# Patient Record
Sex: Male | Born: 1975 | Race: Black or African American | Hispanic: No | Marital: Married | Smoking: Current some day smoker
Health system: Southern US, Community
[De-identification: ages and names within clinical notes are randomized; demographics above are authoritative.]

## PROBLEM LIST (undated history)

## (undated) DIAGNOSIS — Z87442 Personal history of urinary calculi: Secondary | ICD-10-CM

## (undated) DIAGNOSIS — I10 Essential (primary) hypertension: Secondary | ICD-10-CM

## (undated) DIAGNOSIS — K219 Gastro-esophageal reflux disease without esophagitis: Secondary | ICD-10-CM

## (undated) DIAGNOSIS — F329 Major depressive disorder, single episode, unspecified: Secondary | ICD-10-CM

## (undated) DIAGNOSIS — F419 Anxiety disorder, unspecified: Secondary | ICD-10-CM

## (undated) DIAGNOSIS — K8020 Calculus of gallbladder without cholecystitis without obstruction: Secondary | ICD-10-CM

## (undated) DIAGNOSIS — F32A Depression, unspecified: Secondary | ICD-10-CM

## (undated) DIAGNOSIS — D497 Neoplasm of unspecified behavior of endocrine glands and other parts of nervous system: Secondary | ICD-10-CM

---

## 2001-03-29 ENCOUNTER — Emergency Department (HOSPITAL_COMMUNITY): Admission: EM | Admit: 2001-03-29 | Discharge: 2001-03-29 | Payer: Self-pay | Admitting: Emergency Medicine

## 2001-08-03 ENCOUNTER — Emergency Department (HOSPITAL_COMMUNITY): Admission: EM | Admit: 2001-08-03 | Discharge: 2001-08-03 | Payer: Self-pay | Admitting: Emergency Medicine

## 2002-07-08 ENCOUNTER — Encounter: Payer: Self-pay | Admitting: *Deleted

## 2002-07-08 ENCOUNTER — Emergency Department (HOSPITAL_COMMUNITY): Admission: EM | Admit: 2002-07-08 | Discharge: 2002-07-08 | Payer: Self-pay | Admitting: *Deleted

## 2003-01-18 ENCOUNTER — Emergency Department (HOSPITAL_COMMUNITY): Admission: EM | Admit: 2003-01-18 | Discharge: 2003-01-18 | Payer: Self-pay | Admitting: *Deleted

## 2005-09-08 ENCOUNTER — Emergency Department (HOSPITAL_COMMUNITY): Admission: EM | Admit: 2005-09-08 | Discharge: 2005-09-08 | Payer: Self-pay | Admitting: Emergency Medicine

## 2006-11-21 ENCOUNTER — Emergency Department (HOSPITAL_COMMUNITY): Admission: EM | Admit: 2006-11-21 | Discharge: 2006-11-21 | Payer: Self-pay | Admitting: Emergency Medicine

## 2008-05-26 ENCOUNTER — Emergency Department (HOSPITAL_COMMUNITY): Admission: EM | Admit: 2008-05-26 | Discharge: 2008-05-26 | Payer: Self-pay

## 2009-09-08 ENCOUNTER — Emergency Department (HOSPITAL_COMMUNITY): Admission: EM | Admit: 2009-09-08 | Discharge: 2009-09-08 | Payer: Self-pay | Admitting: Emergency Medicine

## 2010-04-23 LAB — URINALYSIS, ROUTINE W REFLEX MICROSCOPIC
Glucose, UA: NEGATIVE mg/dL
Ketones, ur: NEGATIVE mg/dL
pH: 5.5 (ref 5.0–8.0)

## 2010-05-19 LAB — URINALYSIS, ROUTINE W REFLEX MICROSCOPIC
Bilirubin Urine: NEGATIVE
Nitrite: NEGATIVE
Specific Gravity, Urine: 1.015 (ref 1.005–1.030)
pH: 7 (ref 5.0–8.0)

## 2010-06-14 ENCOUNTER — Emergency Department (HOSPITAL_COMMUNITY)
Admission: EM | Admit: 2010-06-14 | Discharge: 2010-06-14 | Disposition: A | Discharge status: Home / Self Care | Payer: Self-pay | Attending: Emergency Medicine | Admitting: Emergency Medicine

## 2010-06-14 DIAGNOSIS — Z008 Encounter for other general examination: Secondary | ICD-10-CM | POA: Insufficient documentation

## 2010-07-03 ENCOUNTER — Emergency Department (HOSPITAL_COMMUNITY)
Admission: EM | Admit: 2010-07-03 | Discharge: 2010-07-03 | Disposition: A | Discharge status: Home / Self Care | Payer: Self-pay | Attending: Emergency Medicine | Admitting: Emergency Medicine

## 2010-07-03 DIAGNOSIS — S335XXA Sprain of ligaments of lumbar spine, initial encounter: Secondary | ICD-10-CM | POA: Insufficient documentation

## 2010-07-03 DIAGNOSIS — X500XXA Overexertion from strenuous movement or load, initial encounter: Secondary | ICD-10-CM | POA: Insufficient documentation

## 2010-07-03 DIAGNOSIS — I10 Essential (primary) hypertension: Secondary | ICD-10-CM | POA: Insufficient documentation

## 2010-07-03 DIAGNOSIS — Z79899 Other long term (current) drug therapy: Secondary | ICD-10-CM | POA: Insufficient documentation

## 2010-10-31 ENCOUNTER — Encounter: Payer: Self-pay | Admitting: *Deleted

## 2010-10-31 ENCOUNTER — Emergency Department (HOSPITAL_COMMUNITY)
Admission: EM | Admit: 2010-10-31 | Discharge: 2010-10-31 | Disposition: A | Discharge status: Home / Self Care | Payer: PRIVATE HEALTH INSURANCE | Attending: Emergency Medicine | Admitting: Emergency Medicine

## 2010-10-31 DIAGNOSIS — F17200 Nicotine dependence, unspecified, uncomplicated: Secondary | ICD-10-CM | POA: Insufficient documentation

## 2010-10-31 DIAGNOSIS — I10 Essential (primary) hypertension: Secondary | ICD-10-CM | POA: Insufficient documentation

## 2010-10-31 DIAGNOSIS — R1013 Epigastric pain: Secondary | ICD-10-CM | POA: Insufficient documentation

## 2010-10-31 DIAGNOSIS — R10812 Left upper quadrant abdominal tenderness: Secondary | ICD-10-CM | POA: Insufficient documentation

## 2010-10-31 HISTORY — DX: Essential (primary) hypertension: I10

## 2010-10-31 LAB — COMPREHENSIVE METABOLIC PANEL
ALT: 26 U/L (ref 0–53)
AST: 19 U/L (ref 0–37)
Albumin: 3.7 g/dL (ref 3.5–5.2)
CO2: 29 mEq/L (ref 19–32)
Chloride: 103 mEq/L (ref 96–112)
GFR calc non Af Amer: >60 mL/min (ref >60)
Potassium: 4.6 mEq/L (ref 3.5–5.1)
Sodium: 139 mEq/L (ref 135–145)
Total Bilirubin: 0.3 mg/dL (ref 0.3–1.2)

## 2010-10-31 LAB — CBC
Hemoglobin: 12.4 g/dL — ABNORMAL LOW (ref 13.0–17.0)
RBC: 4.15 MIL/uL — ABNORMAL LOW (ref 4.22–5.81)
WBC: 9.9 10*3/uL (ref 4.0–10.5)

## 2010-10-31 MED ORDER — FAMOTIDINE 20 MG PO TABS: 20.0000 mg | ORAL_TABLET | Freq: Two times a day (BID) | ORAL | Status: DC

## 2010-10-31 NOTE — ED Provider Notes (Signed)
Scribed for Bobby Jakes, MD, the patient was seen in room APA12/APA12. This chart was scribed by [employer]. The patient's care started at 07:52  CSN: [id number] Arrival date & time: 10/31/2010  7:50 AM  Chief Complaint  Patient presents with  . Abdominal Pain    HPI  HPI Bobby Porter is a 35 y.o. male who presents to the Emergency Department complaining of constant LUQ abdominal pain onset 10 pm of last night. Reports pain is aggravated by food. Pain worsens 30-40 minutes after eating and is usually alleviated by baking soda and water. Pain radiates into the back sometimes. Patient reports a history of similar symptoms for 2-3 years with worse symptoms. Rates pain 6/10 on NPS. Denies nausea, vomiting, diarrhea, chest pain, trouble breathing, headache, dysuria or rash.  HPI ELEMENTS:  Location: LUQ abdomen  Onset: 10 p.m 10/30/2010 Timing: constant Severity: 6/10  Modifying factors: aggravated by food Context: as above  Associated symptoms: as above    Past Medical History  Diagnosis Date  . Hypertension     History reviewed. No pertinent past surgical history.  History reviewed. No pertinent family history.  History  Substance Use Topics  . Smoking status: Current Some Day Smoker  . Smokeless tobacco: Not on file  . Alcohol Use: Yes     rarely      Review of Systems  Review of Systems  Respiratory: Negative for shortness of breath.   Cardiovascular: Negative for chest pain.  Gastrointestinal: Positive for abdominal pain. Negative for nausea, vomiting and diarrhea.  Genitourinary: Negative for dysuria.  Skin: Negative for rash.  Neurological: Negative for headaches.  All other systems reviewed and are negative.    Allergies  Review of patient's allergies indicates no known allergies.  Home Medications  No current outpatient prescriptions on file.  Physical Exam    BP 144/94  Temp(Src) 97.7 F (36.5 C) (Oral)  Resp 16  Ht 6\' 2"  (1.88 m)   Wt 280 lb (127.007 kg)  BMI 35.95 kg/m2  SpO2 100%  Physical Exam  Nursing note and vitals reviewed. Constitutional: He is oriented to person, place, and time. He appears well-developed and well-nourished.       Awake, alert, nontoxic appearance with baseline speech for patient.  HENT:  Head: Normocephalic and atraumatic.  Mouth/Throat: Oropharynx is clear and moist. No oropharyngeal exudate.  Eyes: Conjunctivae and EOM are normal. Pupils are equal, round, and reactive to light.  Neck: Neck supple. No tracheal deviation present.  Cardiovascular: Normal rate, regular rhythm and normal heart sounds.   No murmur heard. Pulmonary/Chest: Effort normal and breath sounds normal. No respiratory distress. He has no wheezes. He has no rales. He exhibits no tenderness.  Abdominal: Soft. Bowel sounds are normal. He exhibits no mass. There is tenderness (mild LUQ). There is no rebound and no guarding.  Musculoskeletal:       Baseline ROM, moves extremities with no obvious new focal weakness.  Neurological: He is alert and oriented to person, place, and time. No cranial nerve deficit.       Awake, alert, cooperative and aware of situation; motor strength bilaterally; sensation normal to light touch bilaterally; peripheral visual fields full to confrontation; no facial asymmetry; tongue midline; major cranial nerves appear intact; no pronator drift, normal finger to nose bilaterally  Skin: Skin is warm and dry. No rash noted.  Psychiatric: He has a normal mood and affect. His behavior is normal.    ED Course  Procedures  OTHER  DATA REVIEWED: Nursing notes, vital signs, and past medical records reviewed.    DIAGNOSTIC STUDIES: Oxygen Saturation is 100% on room air, normal by my interpretation.    LABS / RADIOLOGY:  Results for orders placed during the hospital encounter of 10/31/10  CBC      Component Value Range   WBC 9.9  4.0 - 10.5 (K/uL)   RBC 4.15 (*) 4.22 - 5.81 (MIL/uL)    Hemoglobin 12.4 (*) 13.0 - 17.0 (g/dL)   HCT 14.7 (*) 82.9 - 52.0 (%)   MCV 90.8  78.0 - 100.0 (fL)   MCH 29.9  26.0 - 34.0 (pg)   MCHC 32.9  30.0 - 36.0 (g/dL)   RDW 56.2  13.0 - 86.5 (%)   Platelets 330  150 - 400 (K/uL)  COMPREHENSIVE METABOLIC PANEL      Component Value Range   Sodium 139  135 - 145 (mEq/L)   Potassium PENDING  3.5 - 5.1 (mEq/L)   Chloride 103  96 - 112 (mEq/L)   CO2 29  19 - 32 (mEq/L)   Glucose, Bld 103 (*) 70 - 99 (mg/dL)   BUN 14  6 - 23 (mg/dL)   Creatinine, Ser 7.84  0.50 - 1.35 (mg/dL)   Calcium 9.2  8.4 - 69.6 (mg/dL)   Total Protein 7.1  6.0 - 8.3 (g/dL)   Albumin 3.7  3.5 - 5.2 (g/dL)   AST PENDING  0 - 37 (U/L)   ALT 26  0 - 53 (U/L)   Alkaline Phosphatase 109  39 - 117 (U/L)   Total Bilirubin 0.3  0.3 - 1.2 (mg/dL)   GFR calc non Af Amer >60  >60 (mL/min)   GFR calc Af Amer >60  >60 (mL/min)  LIPASE, BLOOD      Component Value Range   Lipase 55  11 - 59 (U/L)      ED COURSE / COORDINATION OF CARE: 07:53 - EDMD examined patient. Patient declined CT scan offer. EDMD ordered CBC, CMP and Lipase, blood.   MDM:  SUSPECT PUD OR GASTRITIS BASED ON HX WILL RX WITH PEPCID. OFFERED CT SCAN BUT REFUSED.   IMPRESSION: Diagnoses that have been ruled out:  Diagnoses that are still under consideration:  Final diagnoses:  Epigastric abdominal pain    PLAN:  Home The patient is to return the emergency department if there is any worsening of symptoms. I have reviewed the discharge instructions with the patient  CONDITION ON DISCHARGE: Stable  DISCHARGE MEDICATIONS: New Prescriptions   No medications on file    SCRIBE ATTESTATION:  I personally performed the services described in this documentation, which was scribed in my presence. The recorded information has been reviewed and considered. Bobby Jakes, MD    Bobby Jakes, MD 10/31/10 [phone]

## 2010-10-31 NOTE — ED Notes (Signed)
Pt c/o left sided abd pain x 1 day; pt denies any n/v

## 2010-10-31 NOTE — ED Notes (Signed)
No Change in symptoms--awaiting lab results

## 2010-10-31 NOTE — ED Notes (Addendum)
Dr. Zackowski in to assess pt.  

## 2010-10-31 NOTE — ED Notes (Signed)
C/o intermittent sharp pain beneath left breast area--onset last p.m.  Pain is recurrent and he does have an appt. Scheduled next week for further evaluation.  Nausea but no vomiting.  Rates pain 6 on 1-10 scale and denies any difficulty or increased urgency/frequency with urination.

## 2012-02-05 ENCOUNTER — Encounter (HOSPITAL_COMMUNITY): Payer: Self-pay | Admitting: *Deleted

## 2012-02-05 ENCOUNTER — Emergency Department (HOSPITAL_COMMUNITY)
Admission: EM | Admit: 2012-02-05 | Discharge: 2012-02-06 | Disposition: A | Discharge status: Home / Self Care | Payer: 59 | Attending: Emergency Medicine | Admitting: Emergency Medicine

## 2012-02-05 DIAGNOSIS — F17200 Nicotine dependence, unspecified, uncomplicated: Secondary | ICD-10-CM | POA: Insufficient documentation

## 2012-02-05 DIAGNOSIS — I10 Essential (primary) hypertension: Secondary | ICD-10-CM | POA: Insufficient documentation

## 2012-02-05 DIAGNOSIS — R51 Headache: Secondary | ICD-10-CM

## 2012-02-05 DIAGNOSIS — Z79899 Other long term (current) drug therapy: Secondary | ICD-10-CM | POA: Insufficient documentation

## 2012-02-05 DIAGNOSIS — R1110 Vomiting, unspecified: Secondary | ICD-10-CM | POA: Insufficient documentation

## 2012-02-05 MED ORDER — IBUPROFEN 800 MG PO TABS
800.0000 mg | ORAL_TABLET | Freq: Once | ORAL | Status: AC
  Administered 2012-02-06: 800 mg via ORAL
  Filled 2012-02-05: qty 1

## 2012-02-05 NOTE — ED Notes (Signed)
Pt c/o hypertension and headache. Pt reports his BP was in 160s earlier today. Pt states he has been diagnosed with hypertension but is not rx to any meds.

## 2012-02-05 NOTE — ED Provider Notes (Signed)
History   This chart was scribed for [employer]. Colon Branch, MD by Leone Payor, ED Scribe. This patient was seen in room APA11/APA11 and the patient's care was started at 2308.   CSN: [id number]  Arrival date & time 02/05/12  4098   First MD Initiated Contact with Patient 02/05/12 2308      Chief Complaint  Patient presents with  . Hypertension  . Emesis     The history is provided by the patient. No language interpreter was used.   Bobby Porter is a 36 y.o. male with HTN who presents to the Emergency Department complaining of moderate headaches starting 5.5 hours ago. Pt denies taking any OTC medications for the current pain. Pt reports having problems with his HTN medication so he stopped taking them. Pt is currently not nauseous. He has associated vomiting x1, chills. He denies nausea, diarrhea.   No PCP Pt is a current everyday smoker and occasional alcohol user.  Past Medical History  Diagnosis Date  . Hypertension     History reviewed. No pertinent past surgical history.  History reviewed. No pertinent family history.  History  Substance Use Topics  . Smoking status: Current Some Day Smoker  . Smokeless tobacco: Not on file  . Alcohol Use: Yes     Comment: rarely      Review of Systems A complete 10 system review of systems was obtained and all systems are negative except as noted in the HPI and PMH.    Allergies  Review of patient's allergies indicates no known allergies.  Home Medications   Current Outpatient Rx  Name  Route  Sig  Dispense  Refill  . FAMOTIDINE 20 MG PO TABS   Oral   Take 1 tablet (20 mg total) by mouth 2 (two) times daily.   30 tablet   0     BP 122/84  Pulse 69  Temp 97.9 F (36.6 C) (Oral)  Resp 20  Ht 6\' 3"  (1.905 m)  Wt 285 lb (129.275 kg)  BMI 35.62 kg/m2  SpO2 99%  Physical Exam  Nursing note and vitals reviewed. Constitutional: He is oriented to person, place, and time. He appears well-developed and  well-nourished.  HENT:  Head: Normocephalic and atraumatic.  Eyes: EOM are normal.  Neck: Normal range of motion.  Cardiovascular: Normal rate, regular rhythm, normal heart sounds and intact distal pulses.   Pulmonary/Chest: Effort normal and breath sounds normal. No respiratory distress.  Abdominal: Soft. He exhibits no distension. There is no tenderness.  Musculoskeletal: Normal range of motion.  Neurological: He is alert and oriented to person, place, and time.  Skin: Skin is warm and dry.  Psychiatric: He has a normal mood and affect. Judgment normal.    ED Course  Procedures (including critical care time)  DIAGNOSTIC STUDIES: Oxygen Saturation is 99% on room air, normal by my interpretation.    COORDINATION OF CARE:  11:32 PM Discussed treatment plan which includes ibuprofen for headaches with pt at bedside and pt agreed to plan.       MDM  Patient with headache that began at 6 PM. Given ibuprofen with relief.Pt stable in ED with no significant deterioration in condition.The patient appears reasonably screened and/or stabilized for discharge and I doubt any other medical condition or other [facility] requiring further screening, evaluation, or treatment in the ED at this time prior to discharge.  I personally performed the services described in this documentation, which was scribed in my presence. The recorded  information has been reviewed and considered.   MDM Reviewed: nursing note and vitals           Nicoletta Dress. Colon Branch, MD 02/06/12 [place] Manners

## 2012-02-05 NOTE — ED Notes (Signed)
Pt c/o hypertension.

## 2012-02-06 NOTE — ED Notes (Signed)
Pt discharged. Pt stable at time of discharge. Medications reviewed pt has no questions regarding discharge at this time. Pt voiced understanding of discharge instructions.  

## 2013-04-29 ENCOUNTER — Emergency Department (HOSPITAL_COMMUNITY)
Admission: EM | Admit: 2013-04-29 | Discharge: 2013-04-29 | Disposition: A | Discharge status: Home / Self Care | Payer: BC Managed Care – PPO | Attending: Emergency Medicine | Admitting: Emergency Medicine

## 2013-04-29 ENCOUNTER — Encounter (HOSPITAL_COMMUNITY): Payer: Self-pay | Admitting: Emergency Medicine

## 2013-04-29 DIAGNOSIS — N419 Inflammatory disease of prostate, unspecified: Secondary | ICD-10-CM | POA: Insufficient documentation

## 2013-04-29 DIAGNOSIS — F17200 Nicotine dependence, unspecified, uncomplicated: Secondary | ICD-10-CM | POA: Insufficient documentation

## 2013-04-29 DIAGNOSIS — Z79899 Other long term (current) drug therapy: Secondary | ICD-10-CM | POA: Insufficient documentation

## 2013-04-29 DIAGNOSIS — I10 Essential (primary) hypertension: Secondary | ICD-10-CM | POA: Insufficient documentation

## 2013-04-29 LAB — URINALYSIS, ROUTINE W REFLEX MICROSCOPIC
BILIRUBIN URINE: NEGATIVE
Glucose, UA: NEGATIVE mg/dL
Hgb urine dipstick: NEGATIVE
Ketones, ur: NEGATIVE mg/dL
Leukocytes, UA: NEGATIVE
NITRITE: NEGATIVE
Protein, ur: NEGATIVE mg/dL
UROBILINOGEN UA: 0.2 mg/dL (ref 0.0–1.0)
pH: 6 (ref 5.0–8.0)

## 2013-04-29 MED ORDER — CIPROFLOXACIN HCL 500 MG PO TABS: 500.0000 mg | ORAL_TABLET | Freq: Two times a day (BID) | ORAL | Status: DC

## 2013-04-29 MED ORDER — HYDROCODONE-ACETAMINOPHEN 7.5-325 MG PO TABS: 1.0000 | ORAL_TABLET | ORAL | Status: DC | PRN

## 2013-04-29 MED ORDER — METHOCARBAMOL 500 MG PO TABS
1000.0000 mg | ORAL_TABLET | Freq: Once | ORAL | Status: DC
  Filled 2013-04-29: qty 2

## 2013-04-29 MED ORDER — MAGNESIUM HYDROXIDE 400 MG/5ML PO SUSP
15.0000 mL | Freq: Once | ORAL | Status: AC
  Administered 2013-04-29: 15 mL via ORAL
  Filled 2013-04-29: qty 30

## 2013-04-29 MED ORDER — HYDROCODONE-ACETAMINOPHEN 5-325 MG PO TABS
2.0000 | ORAL_TABLET | Freq: Once | ORAL | Status: AC
  Administered 2013-04-29: 2 via ORAL
  Filled 2013-04-29: qty 2

## 2013-04-29 MED ORDER — CIPROFLOXACIN HCL 250 MG PO TABS
500.0000 mg | ORAL_TABLET | Freq: Once | ORAL | Status: AC
  Administered 2013-04-29: 500 mg via ORAL
  Filled 2013-04-29: qty 2

## 2013-04-29 MED ORDER — MELOXICAM 15 MG PO TABS: 15.0000 mg | ORAL_TABLET | Freq: Every day | ORAL | Status: DC

## 2013-04-29 MED ORDER — KETOROLAC TROMETHAMINE 10 MG PO TABS
10.0000 mg | ORAL_TABLET | Freq: Once | ORAL | Status: DC
  Filled 2013-04-29: qty 1

## 2013-04-29 NOTE — Discharge Instructions (Signed)
Your exam is consistent with prostatitis. Please increase fluids. Please use a stool softener until this problem has resolved. Please use Cipro 2 times daily with food. Use Mobic daily until all taken. Please use Norco for pain. This medication may cause drowsiness, please use with caution. Please see Dr. Nevada Crane in the next 10 days for recheck. Please return to the emergency department or see Dr. Nevada Crane if any problems with fever, nausea vomiting, or deterioration in your condition. Prostatitis The prostate gland is about the size and shape of a walnut. It is located just below your bladder. It produces one of the components of semen, which is made up of sperm and the fluids that help nourish and transport it out from the testicles. Prostatitis is inflammation of the prostate gland.  There are four types of prostatitis:  Acute bacterial prostatitis This is the least common type of prostatitis. It starts quickly and usually is associated with a bladder infection, high fever, and shaking chills. It can occur at any age.  Chronic bacterial prostatitis This is a persistent bacterial infection in the prostate. It usually develops from repeated acute bacterial prostatitis or acute bacterial prostatitis that was not properly treated. It can occur in men of any age but is most common in middle-aged men whose prostate has begun to enlarge. The symptoms are not as severe as those in acute bacterial prostatitis. Discomfort in the part of your body that is in front of your rectum and below your scrotum (perineum), lower abdomen, or in the head of your penis (glans) may represent your primary discomfort.  Chronic prostatitis (nonbacterial) This is the most common type of prostatitis. It is inflammation of the prostate gland that is not caused by a bacterial infection. The cause is unknown and may be associated with a viral infection or autoimmune disorder.  Prostatodynia (pelvic floor disorder) This is associated with  increased muscular tone in the pelvis surrounding the prostate. CAUSES The causes of bacterial prostatitis are bacterial infection. The causes of the other types of prostatitis are unknown.  SYMPTOMS  Symptoms can vary depending upon the type of prostatitis that exists. There can also be overlap in symptoms. Possible symptoms for each type of prostatitis are listed below. Acute Bacterial Prostatitis  Painful urination.  Fever or chills.  Muscle or joint pains.  Low back pain.  Low abdominal pain.  Inability to empty bladder completely. Chronic Bacterial Prostatitis, Chronic Nonbacterial Prostatitis, and Prostatodynia  Sudden urge to urinate.  Frequent urination.  Difficulty starting urine stream.  Weak urine stream.  Discharge from the urethra.  Dribbling after urination.  Rectal pain.  Pain in the testicles, penis, or tip of the penis.  Pain in the perineum.  Problems with sexual function.  Painful ejaculation.  Bloody semen. DIAGNOSIS  In order to diagnose prostatitis, your health care provider will ask about your symptoms. One or more urine samples will be taken and tested (urinalysis). If the urinalysis result is negative for bacteria, your health care provider may use a finger to feel your prostate (digital rectal exam). This exam helps your health care provider determine if your prostate is swollen and tender. It will also produce a specimen of semen that can be analyzed. TREATMENT  Treatment for prostatitis depends on the cause. If a bacterial infection is the cause, it can be treated with antibiotic medicine. In cases of chronic bacterial prostatitis, the use of antibiotics for up to 1 month or 6 weeks may be necessary. Your health care  provider may instruct you to take sitz baths to help relieve pain. A sitz bath is a bath of hot water in which your hips and buttocks are under water. This relaxes the pelvic floor muscles and often helps to relieve the pressure  on your prostate. HOME CARE INSTRUCTIONS   Take all medicines as directed by your health care provider.  Take sitz baths as directed by your health care provider. SEEK MEDICAL CARE IF:   Your symptoms get worse, not better.  You have a fever. SEEK IMMEDIATE MEDICAL CARE IF:   You have chills.  You feel nauseous or vomit.  You feel lightheaded or faint.  You are unable to urinate.  You have blood or blood clots in your urine. Document Released: 01/22/2000 Document Revised: 11/14/2012 Document Reviewed: 08/13/2012 [facility] Patient Information 2014 [place].

## 2013-04-29 NOTE — ED Notes (Signed)
C/o tenderness near rectum on left side of buttock

## 2013-04-29 NOTE — ED Notes (Signed)
Pt c/o rectal pain and tenderness to scrotum x 2 days. Pt reports hard area to left buttock.  lnbm-today.

## 2013-04-29 NOTE — ED Provider Notes (Signed)
CSN: [id number]     Arrival date & time 04/29/13  1845 History   First MD Initiated Contact with Patient 04/29/13 2010     Chief Complaint  Patient presents with  . Abscess     (Consider location/radiation/quality/duration/timing/severity/associated sxs/prior Treatment) HPI Comments: Patient presents to the emergency department with complaint of pain in the rectal area. He states that there is also tenderness between his rectal area and scrotum. This started about 2 days ago. The patient denies any injury or trauma to this area. He's not had any recent operations or procedures. The patient has not had any pain with urination, and there's been no blood in the urine. There is also been no blood in the stools. The patient initially thought that he had a hemorrhoid, but states he had his significant other to check and there is no hemorrhoid there. There's been no high fever reported. The patient now presents to the emergency department for additional evaluation.  The history is provided by the patient.    Past Medical History  Diagnosis Date  . Hypertension    History reviewed. No pertinent past surgical history. No family history on file. History  Substance Use Topics  . Smoking status: Current Some Day Smoker  . Smokeless tobacco: Not on file  . Alcohol Use: Yes     Comment: rarely    Review of Systems  Constitutional: Negative for activity change.       All ROS Neg except as noted in HPI  HENT: Negative for nosebleeds.   Eyes: Negative for photophobia and discharge.  Respiratory: Negative for cough, shortness of breath and wheezing.   Cardiovascular: Negative for chest pain and palpitations.  Gastrointestinal: Negative for abdominal pain and blood in stool.  Genitourinary: Negative for dysuria, urgency, frequency, hematuria, discharge, penile swelling, scrotal swelling and penile pain.  Musculoskeletal: Negative for arthralgias, back pain and neck pain.  Skin: Negative.    Neurological: Negative for dizziness, seizures and speech difficulty.  Psychiatric/Behavioral: Negative for hallucinations and confusion.      Allergies  Review of patient's allergies indicates no known allergies.  Home Medications   Current Outpatient Rx  Name  Route  Sig  Dispense  Refill  . EXPIRED: famotidine (PEPCID) 20 MG tablet   Oral   Take 1 tablet (20 mg total) by mouth 2 (two) times daily.   30 tablet   0    BP 138/76  Pulse 97  Temp(Src) 97.5 F (36.4 C) (Oral)  Resp 20  Ht 6\' 2"  (1.88 m)  Wt 319 lb 11.2 oz (145.015 kg)  BMI 41.03 kg/m2  SpO2 99% Physical Exam  Nursing note and vitals reviewed. Constitutional: He is oriented to person, place, and time. He appears well-developed and well-nourished.  Non-toxic appearance.  HENT:  Head: Normocephalic.  Right Ear: Tympanic membrane and external ear normal.  Left Ear: Tympanic membrane and external ear normal.  Eyes: EOM and lids are normal. Pupils are equal, round, and reactive to light.  Neck: Normal range of motion. Neck supple. Carotid bruit is not present.  Cardiovascular: Normal rate, regular rhythm, normal heart sounds, intact distal pulses and normal pulses.   Pulmonary/Chest: Breath sounds normal. No respiratory distress.  Abdominal: Soft. Bowel sounds are normal. There is no tenderness. There is no guarding.  Genitourinary:  There no lesions or evidence of fistula of the external anal area. There is severe pain upon attempting an internal examination. The prostate is quite tender to palpation. No palpable internal hemorrhoids  or internal mass appreciated. Good sphincter tone is noted.  Musculoskeletal: Normal range of motion.  Lymphadenopathy:       Head (right side): No submandibular adenopathy present.       Head (left side): No submandibular adenopathy present.    He has no cervical adenopathy.  Neurological: He is alert and oriented to person, place, and time. He has normal strength. No cranial  nerve deficit or sensory deficit.  Skin: Skin is warm and dry.  Psychiatric: He has a normal mood and affect. His speech is normal.    ED Course  Procedures (including critical care time) Labs Review Labs Reviewed  URINALYSIS, ROUTINE W REFLEX MICROSCOPIC - Abnormal; Notable for the following:    Specific Gravity, Urine <1.005 (*)    All other components within normal limits   Imaging Review No results found.   EKG Interpretation None      MDM Examination is consistent with prostatitis. Urine analysis is negative. Patient's significant other states that the patient takes testosterone injections. Patient advised of suspected cause of his pain. I've advised the patient to use Cipro 2 times daily, to use Norco every 4 hours if needed for pain, and to use Mobic 15 mg one daily. Patient is also advised to see his physician Dr. Nevada Crane for evaluation of this problem, and recheck of his testosterone injections.    Final diagnoses:  None    *I have reviewed nursing notes, vital signs, and all appropriate lab and imaging results for this patient.Lenox Ahr, PA-C 04/29/13 2115

## 2013-04-30 NOTE — ED Provider Notes (Signed)
Medical screening examination/treatment/procedure(s) were performed by non-physician practitioner and as supervising physician I was immediately available for consultation/collaboration.   EKG Interpretation None      Rolland Porter, MD, Abram Sander   Janice Norrie, MD 04/30/13 [phone]

## 2013-05-01 ENCOUNTER — Encounter (HOSPITAL_COMMUNITY): Payer: Self-pay | Admitting: Emergency Medicine

## 2013-05-01 ENCOUNTER — Inpatient Hospital Stay (HOSPITAL_COMMUNITY)
Admission: EM | Admit: 2013-05-01 | Discharge: 2013-05-04 | DRG: 348 | Disposition: A | Discharge status: Home / Self Care | Payer: BC Managed Care – PPO | Attending: General Surgery | Admitting: General Surgery

## 2013-05-01 ENCOUNTER — Emergency Department (HOSPITAL_COMMUNITY): Payer: BC Managed Care – PPO

## 2013-05-01 DIAGNOSIS — K610 Anal abscess: Secondary | ICD-10-CM | POA: Diagnosis present

## 2013-05-01 DIAGNOSIS — D72829 Elevated white blood cell count, unspecified: Secondary | ICD-10-CM | POA: Diagnosis present

## 2013-05-01 DIAGNOSIS — I10 Essential (primary) hypertension: Secondary | ICD-10-CM | POA: Diagnosis present

## 2013-05-01 DIAGNOSIS — K612 Anorectal abscess: Principal | ICD-10-CM | POA: Diagnosis present

## 2013-05-01 DIAGNOSIS — Z6841 Body Mass Index (BMI) 40.0 and over, adult: Secondary | ICD-10-CM

## 2013-05-01 DIAGNOSIS — F17200 Nicotine dependence, unspecified, uncomplicated: Secondary | ICD-10-CM | POA: Diagnosis present

## 2013-05-01 LAB — URINALYSIS, ROUTINE W REFLEX MICROSCOPIC
Bilirubin Urine: NEGATIVE
Glucose, UA: NEGATIVE mg/dL
HGB URINE DIPSTICK: NEGATIVE
Leukocytes, UA: NEGATIVE
Nitrite: NEGATIVE
PROTEIN: NEGATIVE mg/dL
Specific Gravity, Urine: 1.025 (ref 1.005–1.030)
UROBILINOGEN UA: 0.2 mg/dL (ref 0.0–1.0)
pH: 6 (ref 5.0–8.0)

## 2013-05-01 LAB — CBC WITH DIFFERENTIAL/PLATELET
BASOS ABS: 0 10*3/uL (ref 0.0–0.1)
BASOS PCT: 0 % (ref 0–1)
EOS ABS: 0 10*3/uL (ref 0.0–0.7)
EOS PCT: 0 % (ref 0–5)
HCT: 41.5 % (ref 39.0–52.0)
Hemoglobin: 14 g/dL (ref 13.0–17.0)
LYMPHS PCT: 12 % (ref 12–46)
Lymphs Abs: 2.1 10*3/uL (ref 0.7–4.0)
MCH: 29.7 pg (ref 26.0–34.0)
MCHC: 33.7 g/dL (ref 30.0–36.0)
MCV: 88.1 fL (ref 78.0–100.0)
Monocytes Absolute: 1.8 10*3/uL — ABNORMAL HIGH (ref 0.1–1.0)
Monocytes Relative: 10 % (ref 3–12)
Neutro Abs: 13.3 10*3/uL — ABNORMAL HIGH (ref 1.7–7.7)
Neutrophils Relative %: 77 % (ref 43–77)
PLATELETS: 359 10*3/uL (ref 150–400)
RBC: 4.71 MIL/uL (ref 4.22–5.81)
RDW: 11.8 % (ref 11.5–15.5)
WBC: 17.1 10*3/uL — AB (ref 4.0–10.5)

## 2013-05-01 LAB — BASIC METABOLIC PANEL
BUN: 7 mg/dL (ref 6–23)
CALCIUM: 9.7 mg/dL (ref 8.4–10.5)
CO2: 27 mEq/L (ref 19–32)
Chloride: 101 mEq/L (ref 96–112)
Creatinine, Ser: 0.85 mg/dL (ref 0.50–1.35)
Glucose, Bld: 100 mg/dL — ABNORMAL HIGH (ref 70–99)
Potassium: 3.6 mEq/L — ABNORMAL LOW (ref 3.7–5.3)
SODIUM: 140 meq/L (ref 137–147)

## 2013-05-01 MED ORDER — CIPROFLOXACIN IN D5W 400 MG/200ML IV SOLN
400.0000 mg | Freq: Once | INTRAVENOUS | Status: AC
  Administered 2013-05-01: 400 mg via INTRAVENOUS
  Filled 2013-05-01: qty 200

## 2013-05-01 MED ORDER — MORPHINE SULFATE 4 MG/ML IJ SOLN
4.0000 mg | Freq: Once | INTRAMUSCULAR | Status: AC
  Administered 2013-05-01: 4 mg via INTRAVENOUS
  Filled 2013-05-01: qty 1

## 2013-05-01 MED ORDER — SODIUM CHLORIDE 0.9 % IV SOLN: INTRAVENOUS | Status: DC

## 2013-05-01 MED ORDER — METRONIDAZOLE IN NACL 5-0.79 MG/ML-% IV SOLN
500.0000 mg | Freq: Three times a day (TID) | INTRAVENOUS | Status: DC
  Administered 2013-05-02 – 2013-05-04 (×7): 500 mg via INTRAVENOUS
  Filled 2013-05-01 (×6): qty 100

## 2013-05-01 MED ORDER — ONDANSETRON HCL 4 MG/2ML IJ SOLN: 4.0000 mg | Freq: Four times a day (QID) | INTRAMUSCULAR | Status: DC | PRN

## 2013-05-01 MED ORDER — NICOTINE 21 MG/24HR TD PT24
21.0000 mg | MEDICATED_PATCH | Freq: Every day | TRANSDERMAL | Status: DC
  Filled 2013-05-01 (×2): qty 1

## 2013-05-01 MED ORDER — OXYCODONE HCL 5 MG PO TABS
5.0000 mg | ORAL_TABLET | ORAL | Status: DC | PRN
  Administered 2013-05-02 – 2013-05-04 (×6): 5 mg via ORAL
  Filled 2013-05-01 (×6): qty 1

## 2013-05-01 MED ORDER — DIPHENHYDRAMINE HCL 12.5 MG/5ML PO ELIX: 12.5000 mg | ORAL_SOLUTION | Freq: Four times a day (QID) | ORAL | Status: DC | PRN

## 2013-05-01 MED ORDER — KCL IN DEXTROSE-NACL 20-5-0.45 MEQ/L-%-% IV SOLN
INTRAVENOUS | Status: DC
  Administered 2013-05-01 – 2013-05-02 (×2): via INTRAVENOUS

## 2013-05-01 MED ORDER — IOHEXOL 300 MG/ML  SOLN
100.0000 mL | Freq: Once | INTRAMUSCULAR | Status: AC | PRN
  Administered 2013-05-01: 100 mL via INTRAVENOUS

## 2013-05-01 MED ORDER — ACETAMINOPHEN 650 MG RE SUPP: 650.0000 mg | Freq: Four times a day (QID) | RECTAL | Status: DC | PRN

## 2013-05-01 MED ORDER — SODIUM CHLORIDE 0.9 % IV BOLUS (SEPSIS)
1000.0000 mL | Freq: Once | INTRAVENOUS | Status: AC
  Administered 2013-05-01: 1000 mL via INTRAVENOUS

## 2013-05-01 MED ORDER — METRONIDAZOLE IN NACL 5-0.79 MG/ML-% IV SOLN
500.0000 mg | Freq: Once | INTRAVENOUS | Status: AC
  Administered 2013-05-01: 500 mg via INTRAVENOUS
  Filled 2013-05-01: qty 100

## 2013-05-01 MED ORDER — ONDANSETRON HCL 4 MG/2ML IJ SOLN
4.0000 mg | Freq: Once | INTRAMUSCULAR | Status: AC
  Administered 2013-05-01: 4 mg via INTRAVENOUS
  Filled 2013-05-01: qty 2

## 2013-05-01 MED ORDER — DIPHENHYDRAMINE HCL 50 MG/ML IJ SOLN: 12.5000 mg | Freq: Four times a day (QID) | INTRAMUSCULAR | Status: DC | PRN

## 2013-05-01 MED ORDER — ENOXAPARIN SODIUM 40 MG/0.4ML ~~LOC~~ SOLN
40.0000 mg | SUBCUTANEOUS | Status: DC
  Administered 2013-05-01 – 2013-05-04 (×3): 40 mg via SUBCUTANEOUS
  Filled 2013-05-01 (×3): qty 0.4

## 2013-05-01 MED ORDER — HYDROMORPHONE HCL PF 1 MG/ML IJ SOLN
1.0000 mg | INTRAMUSCULAR | Status: DC | PRN
  Administered 2013-05-02 – 2013-05-03 (×10): 1 mg via INTRAVENOUS
  Filled 2013-05-01 (×10): qty 1

## 2013-05-01 MED ORDER — DIAZEPAM 5 MG PO TABS: 5.0000 mg | ORAL_TABLET | Freq: Three times a day (TID) | ORAL | Status: DC | PRN

## 2013-05-01 MED ORDER — CIPROFLOXACIN IN D5W 400 MG/200ML IV SOLN
400.0000 mg | Freq: Two times a day (BID) | INTRAVENOUS | Status: DC
Start: 2013-05-02 — End: 2013-05-04
  Administered 2013-05-02 – 2013-05-04 (×5): 400 mg via INTRAVENOUS
  Filled 2013-05-01 (×5): qty 200

## 2013-05-01 MED ORDER — MELOXICAM 7.5 MG PO TABS
15.0000 mg | ORAL_TABLET | Freq: Every day | ORAL | Status: DC
  Administered 2013-05-02 – 2013-05-04 (×3): 15 mg via ORAL
  Filled 2013-05-01 (×3): qty 2
  Filled 2013-05-01 (×2): qty 1
  Filled 2013-05-01 (×3): qty 2

## 2013-05-01 MED ORDER — ACETAMINOPHEN 325 MG PO TABS: 650.0000 mg | ORAL_TABLET | Freq: Four times a day (QID) | ORAL | Status: DC | PRN

## 2013-05-01 NOTE — ED Notes (Signed)
Pt able to void, urine sent to lab for analysis.

## 2013-05-01 NOTE — Progress Notes (Signed)
Removed patients foley per MD order. Patient tolerated removal well with no complications. Patient is expected to void by 0310. Will continue to monitor the patient.

## 2013-05-01 NOTE — ED Notes (Signed)
Pt reports was diagnosed with prostatitis 2 days ago and was put on antibiotics, hydrocodone, and mobic.  Reports pain got worse, Reports has not voided since 0800 this morning.  Pt says can't strain to void and has a lot of burning in rectum.  Pt diaphoretic.

## 2013-05-01 NOTE — ED Provider Notes (Signed)
CSN: [id number]     Arrival date & time 05/01/13  1439 History   First MD Initiated Contact with Patient 05/01/13 1513     Chief Complaint  Patient presents with  . Rectal Pain     (Consider location/radiation/quality/duration/timing/severity/associated sxs/prior Treatment) HPI Pt seen in the ED 2 days ago for rectal pain, found to have tender prostate and started on Cipro and Norco for presumed prostatitis. He reports minimal improvement in the interim. States he has not been able to urinate since about 8am this morning but does not feel like his bladder is full. He continues to have some discomfort with BM but states pain meds have made his stool hard. Denies any fever, no vomiting.   Past Medical History  Diagnosis Date  . Hypertension    History reviewed. No pertinent past surgical history. No family history on file. History  Substance Use Topics  . Smoking status: Current Some Day Smoker  . Smokeless tobacco: Not on file  . Alcohol Use: Yes     Comment: rarely    Review of Systems All other systems reviewed and are negative except as noted in HPI.     Allergies  Review of patient's allergies indicates no known allergies.  Home Medications   Current Outpatient Rx  Name  Route  Sig  Dispense  Refill  . ciprofloxacin (CIPRO) 500 MG tablet   Oral   Take 1 tablet (500 mg total) by mouth 2 (two) times daily.   20 tablet   0   . EXPIRED: famotidine (PEPCID) 20 MG tablet   Oral   Take 1 tablet (20 mg total) by mouth 2 (two) times daily.   30 tablet   0   . HYDROcodone-acetaminophen (NORCO) 7.5-325 MG per tablet   Oral   Take 1 tablet by mouth every 4 (four) hours as needed for moderate pain.   20 tablet   0   . meloxicam (MOBIC) 15 MG tablet   Oral   Take 1 tablet (15 mg total) by mouth daily.   12 tablet   0    BP 151/79  Pulse 113  Temp(Src) 97 F (36.1 C) (Oral)  Resp 22  Ht 6\' 2"  (1.88 m)  Wt 319 lb (144.697 kg)  BMI 40.94 kg/m2  SpO2  97% Physical Exam  Nursing note and vitals reviewed. Constitutional: He is oriented to person, place, and time. He appears well-developed and well-nourished.  HENT:  Head: Normocephalic and atraumatic.  Eyes: EOM are normal. Pupils are equal, round, and reactive to light.  Neck: Normal range of motion. Neck supple.  Cardiovascular: Normal rate, normal heart sounds and intact distal pulses.   Pulmonary/Chest: Effort normal and breath sounds normal.  Abdominal: Bowel sounds are normal. He exhibits no distension. There is no tenderness. There is no rebound and no guarding.  No significantly enlarged bladder  Genitourinary:  Exquisitely tender to palpation of L perianal area, small amount of blood and ?pus in the lower gluteal cleft of unclear etiology, concerning for perirectal abscess, will send for CT  Musculoskeletal: Normal range of motion. He exhibits no edema and no tenderness.  Neurological: He is alert and oriented to person, place, and time. He has normal strength. No cranial nerve deficit or sensory deficit.  Skin: Skin is warm and dry. No rash noted.  Psychiatric: He has a normal mood and affect.    ED Course  Procedures (including critical care time) Labs Review Labs Reviewed  URINALYSIS, [place]  MICROSCOPIC - Abnormal; Notable for the following:    Ketones, ur TRACE (*)    All other components within normal limits  CBC WITH DIFFERENTIAL - Abnormal; Notable for the following:    WBC 17.1 (*)    Neutro Abs 13.3 (*)    Monocytes Absolute 1.8 (*)    All other components within normal limits  BASIC METABOLIC PANEL - Abnormal; Notable for the following:    Potassium 3.6 (*)    Glucose, Bld 100 (*)    All other components within normal limits  CBC   Imaging Review Ct Pelvis W Contrast  05/01/2013   CLINICAL DATA:  RECTAL PAIN eval perirectal abscess  EXAM: CT PELVIS WITH CONTRAST  TECHNIQUE: Multidetector CT imaging of the pelvis was performed using the standard  protocol following the bolus administration of intravenous contrast.  CONTRAST:  153mL OMNIPAQUE IOHEXOL 300 MG/ML  SOLN  COMPARISON:  None.  FINDINGS: A 3.8 x 2 cm fluid collection is appreciated within the left perianal region.) Sees image 48 series 2) A second area less defined is appreciated adjacent to the lateral border measuring 2.4 x 1.8 cm. There is inflammatory change within the surrounding fat.  There is no evidence of intra pelvic masses, free fluid, or loculated fluid collections. Mild diverticulosis appreciated within the sigmoid colon. A small fat containing umbilical hernia is appreciated. There is no evidence of an inguinal hernia.  The osseous structures are unremarkable.  IMPRESSION: Small perirenal fluid collections on the left consistent with an abscess. A smaller less confluent adjacent focus abscess versus phlegmon.  These results were called by telephone at the time of interpretation on 05/01/2013 at 6:08 PM to Dr. Calvert Cantor , who verbally acknowledged these results.   Electronically Signed   By: Margaree Mackintosh M.D.   On: 05/01/2013 18:10     EKG Interpretation None      MDM   Final diagnoses:  Perianal abscess   CT concerning for perianal abscess. Pt unable to tolerate digital exam. Discussed with Dr. Arnoldo Morale on call for Gen Surg who will admit for Abx and consideration of surgical drainage.     Ariadna Setter B. Karle Starch, MD 05/01/13 2330

## 2013-05-01 NOTE — ED Notes (Signed)
Pt transferred to floor.

## 2013-05-01 NOTE — H&P (Signed)
Bobby Porter is an 38 y.o. male.   Chief Complaint: Perianal pain HPI: Patient is a 38 year old morbidly obese black male who presents with a several-day history of worsening perirectal pain. He initially thought this was hemorrhoidal pain. He presented emergency room and was found on CT scan the abdomen and pelvis to have 2 areas of probable perianal abscesses to the left side. He also had difficulty in voiding and a Foley catheter was placed. He states he may have had this several years ago, but resolved without surgical intervention. Since he's been admitted to the hospital from the emergency room, he feels the abscess has drained and his pain is much improved. He did have a leukocytosis.  Past Medical History  Diagnosis Date  . Hypertension     History reviewed. No pertinent past surgical history.  History reviewed. No pertinent family history. Social History:  reports that he has been smoking.  He does not have any smokeless tobacco history on file. He reports that he drinks alcohol. He reports that he uses illicit drugs (Marijuana).  Allergies: No Known Allergies  Medications Prior to Admission  Medication Sig Dispense Refill  . ciprofloxacin (CIPRO) 500 MG tablet Take 1 tablet (500 mg total) by mouth 2 (two) times daily.  20 tablet  0  . diazepam (VALIUM) 5 MG tablet Take 1 tablet by mouth 3 (three) times daily as needed. Muscle spasm      . HYDROcodone-acetaminophen (NORCO) 7.5-325 MG per tablet Take 1 tablet by mouth every 4 (four) hours as needed for moderate pain.  20 tablet  0  . meloxicam (MOBIC) 15 MG tablet Take 1 tablet (15 mg total) by mouth daily.  12 tablet  0  . testosterone cypionate (DEPOTESTOTERONE CYPIONATE) 200 MG/ML injection Inject 200 mg into the muscle every 21 ( twenty-one) days.        Results for orders placed during the hospital encounter of 05/01/13 (from the past 48 hour(s))  URINALYSIS, ROUTINE W REFLEX MICROSCOPIC     Status: Abnormal   Collection  Time    05/01/13  3:20 PM      Result Value Ref Range   Color, Urine YELLOW  YELLOW   APPearance CLEAR  CLEAR   Specific Gravity, Urine 1.025  1.005 - 1.030   pH 6.0  5.0 - 8.0   Glucose, UA NEGATIVE  NEGATIVE mg/dL   Hgb urine dipstick NEGATIVE  NEGATIVE   Bilirubin Urine NEGATIVE  NEGATIVE   Ketones, ur TRACE (*) NEGATIVE mg/dL   Protein, ur NEGATIVE  NEGATIVE mg/dL   Urobilinogen, UA 0.2  0.0 - 1.0 mg/dL   Nitrite NEGATIVE  NEGATIVE   Leukocytes, UA NEGATIVE  NEGATIVE   Comment: MICROSCOPIC NOT DONE ON URINES WITH NEGATIVE PROTEIN, BLOOD, LEUKOCYTES, NITRITE, OR GLUCOSE <1000 mg/dL.  CBC WITH DIFFERENTIAL     Status: Abnormal   Collection Time    05/01/13  4:27 PM      Result Value Ref Range   WBC 17.1 (*) 4.0 - 10.5 K/uL   RBC 4.71  4.22 - 5.81 MIL/uL   Hemoglobin 14.0  13.0 - 17.0 g/dL   HCT 41.5  39.0 - 52.0 %   MCV 88.1  78.0 - 100.0 fL   MCH 29.7  26.0 - 34.0 pg   MCHC 33.7  30.0 - 36.0 g/dL   RDW 11.8  11.5 - 15.5 %   Platelets 359  150 - 400 K/uL   Neutrophils Relative % 77  43 -  77 %   Neutro Abs 13.3 (*) 1.7 - 7.7 K/uL   Lymphocytes Relative 12  12 - 46 %   Lymphs Abs 2.1  0.7 - 4.0 K/uL   Monocytes Relative 10  3 - 12 %   Monocytes Absolute 1.8 (*) 0.1 - 1.0 K/uL   Eosinophils Relative 0  0 - 5 %   Eosinophils Absolute 0.0  0.0 - 0.7 K/uL   Basophils Relative 0  0 - 1 %   Basophils Absolute 0.0  0.0 - 0.1 K/uL  BASIC METABOLIC PANEL     Status: Abnormal   Collection Time    05/01/13  4:27 PM      Result Value Ref Range   Sodium 140  137 - 147 mEq/L   Potassium 3.6 (*) 3.7 - 5.3 mEq/L   Chloride 101  96 - 112 mEq/L   CO2 27  19 - 32 mEq/L   Glucose, Bld 100 (*) 70 - 99 mg/dL   BUN 7  6 - 23 mg/dL   Creatinine, Ser 0.85  0.50 - 1.35 mg/dL   Calcium 9.7  8.4 - 10.5 mg/dL   GFR calc non Af Amer >90  >90 mL/min   GFR calc Af Amer >90  >90 mL/min   Comment: (NOTE)     The eGFR has been calculated using the CKD EPI equation.     This calculation has not  been validated in all clinical situations.     eGFR's persistently <90 mL/min signify possible Chronic Kidney     Disease.   Ct Pelvis W Contrast  05/01/2013   CLINICAL DATA:  RECTAL PAIN eval perirectal abscess  EXAM: CT PELVIS WITH CONTRAST  TECHNIQUE: Multidetector CT imaging of the pelvis was performed using the standard protocol following the bolus administration of intravenous contrast.  CONTRAST:  150m OMNIPAQUE IOHEXOL 300 MG/ML  SOLN  COMPARISON:  None.  FINDINGS: A 3.8 x 2 cm fluid collection is appreciated within the left perianal region.) Sees image 48 series 2) A second area less defined is appreciated adjacent to the lateral border measuring 2.4 x 1.8 cm. There is inflammatory change within the surrounding fat.  There is no evidence of intra pelvic masses, free fluid, or loculated fluid collections. Mild diverticulosis appreciated within the sigmoid colon. A small fat containing umbilical hernia is appreciated. There is no evidence of an inguinal hernia.  The osseous structures are unremarkable.  IMPRESSION: Small perirenal fluid collections on the left consistent with an abscess. A smaller less confluent adjacent focus abscess versus phlegmon.  These results were called by telephone at the time of interpretation on 05/01/2013 at 6:08 PM to Dr. CCalvert Cantor, who verbally acknowledged these results.   Electronically Signed   By: HMargaree MackintoshM.D.   On: 05/01/2013 18:10    Review of Systems  Constitutional: Positive for malaise/fatigue.  Respiratory: Negative.   Cardiovascular: Negative.   Gastrointestinal: Negative.   Genitourinary: Negative.   Musculoskeletal: Negative.   All other systems reviewed and are negative.    Blood pressure 158/83, pulse 101, temperature 99 F (37.2 C), temperature source Oral, resp. rate 18, height 6' 2"  (1.88 m), weight 144.6 kg (318 lb 12.6 oz), SpO2 96.00%. Physical Exam  Vitals reviewed. Constitutional: He is oriented to person, place, and  time. He appears well-developed and well-nourished.  Obese black male  Cardiovascular: Normal rate, regular rhythm and normal heart sounds.   Respiratory: Effort normal and breath sounds normal.  GI: Soft. Bowel  sounds are normal.  Genitourinary:  Soft fluctuant but not tense area just to the left an outside the anus. Some drainage present. Appears to be collapsed. Examination limited secondary to patient discomfort.  Neurological: He is alert and oriented to person, place, and time.  Skin: Skin is warm and dry.     Assessment/Plan Impression: Perianal abscesses, apparently resolving. Plan: No need for acute surgical intervention at this time. Will continue IV ciprofloxacin and Flagyl. We'll reevaluate in a.m. Will repeat white blood cell count in a.m. Treatment plan has been explained to the patient, who agrees.  Jeneen Doutt A 05/01/2013, 9:07 PM

## 2013-05-02 LAB — CBC
HCT: 36.2 % — ABNORMAL LOW (ref 39.0–52.0)
HEMOGLOBIN: 12.2 g/dL — AB (ref 13.0–17.0)
MCH: 29.9 pg (ref 26.0–34.0)
MCHC: 33.7 g/dL (ref 30.0–36.0)
MCV: 88.7 fL (ref 78.0–100.0)
Platelets: 340 10*3/uL (ref 150–400)
RBC: 4.08 MIL/uL — ABNORMAL LOW (ref 4.22–5.81)
RDW: 11.8 % (ref 11.5–15.5)
WBC: 18.5 10*3/uL — ABNORMAL HIGH (ref 4.0–10.5)

## 2013-05-02 NOTE — Care Management Note (Signed)
    Page 1 of 1   05/02/2013     11:29:58 AM   CARE MANAGEMENT NOTE 05/02/2013  Patient:  Bobby Porter, Bobby Porter   Account Number:  [id number]  Date Initiated:  05/02/2013  Documentation initiated by:  Claretha Cooper  Subjective/Objective Assessment:   Pt admitted for abscess on perianal area. . Lives at home with spouse. PCP is Dr. Wende Neighbors.     Action/Plan:   Anticipated DC Date:     Anticipated DC Plan:  HOME/SELF CARE      DC Planning Services  CM consult      Choice offered to / List presented to:             Status of service:  Completed, signed off Medicare Important Message given?   (If response is "NO", the following Medicare IM given date fields will be blank) Date Medicare IM given:   Date Additional Medicare IM given:    Discharge Disposition:    Per UR Regulation:    If discussed at Long Length of Stay Meetings, dates discussed:    Comments:  05/02/13 Claretha Cooper RN BSN CM

## 2013-05-02 NOTE — Progress Notes (Signed)
Subjective: Still with some perirectal pain.  Objective: Vital signs in last 24 hours: Temp:  [97 F (36.1 C)-99.3 F (37.4 C)] 98.4 F (36.9 C) (03/26 0443) Pulse Rate:  [89-113] 89 (03/26 0443) Resp:  [18-22] 18 (03/26 0443) BP: (121-158)/(74-98) 131/79 mmHg (03/26 0443) SpO2:  [95 %-97 %] 95 % (03/26 0443) Weight:  [144.6 kg (318 lb 12.6 oz)-144.697 kg (319 lb)] 144.6 kg (318 lb 12.6 oz) (03/25 2013) Last BM Date: 05/01/13  Intake/Output from previous day: 03/25 0701 - 03/26 0700 In: 510.8 [I.V.:410.8; IV Piggyback:100] Out: 575 [Urine:575] Intake/Output this shift:    General appearance: alert, cooperative and no distress Resp: clear to auscultation bilaterally Cardio: regular rate and rhythm, S1, S2 normal, no murmur, click, rub or gallop Rectal examination deferred at this time  Lab Results:   Recent Labs  05/01/13 1627 05/02/13 0510  WBC 17.1* 18.5*  HGB 14.0 12.2*  HCT 41.5 36.2*  PLT 359 340   BMET  Recent Labs  05/01/13 1627  NA 140  K 3.6*  CL 101  CO2 27  GLUCOSE 100*  BUN 7  CREATININE 0.85  CALCIUM 9.7   PT/INR No results found for this basename: LABPROT, INR,  in the last 72 hours  Studies/Results: Ct Pelvis W Contrast  05/01/2013   CLINICAL DATA:  RECTAL PAIN eval perirectal abscess  EXAM: CT PELVIS WITH CONTRAST  TECHNIQUE: Multidetector CT imaging of the pelvis was performed using the standard protocol following the bolus administration of intravenous contrast.  CONTRAST:  155mL OMNIPAQUE IOHEXOL 300 MG/ML  SOLN  COMPARISON:  None.  FINDINGS: A 3.8 x 2 cm fluid collection is appreciated within the left perianal region.) Sees image 48 series 2) A second area less defined is appreciated adjacent to the lateral border measuring 2.4 x 1.8 cm. There is inflammatory change within the surrounding fat.  There is no evidence of intra pelvic masses, free fluid, or loculated fluid collections. Mild diverticulosis appreciated within the sigmoid  colon. A small fat containing umbilical hernia is appreciated. There is no evidence of an inguinal hernia.  The osseous structures are unremarkable.  IMPRESSION: Small perirenal fluid collections on the left consistent with an abscess. A smaller less confluent adjacent focus abscess versus phlegmon.  These results were called by telephone at the time of interpretation on 05/01/2013 at 6:08 PM to Dr. Calvert Cantor , who verbally acknowledged these results.   Electronically Signed   By: Margaree Mackintosh M.D.   On: 05/01/2013 18:10    Anti-infectives: Anti-infectives   Start     Dose/Rate Route Frequency Ordered Stop   05/02/13 0800  ciprofloxacin (CIPRO) IVPB 400 mg     400 mg 200 mL/hr over 60 Minutes Intravenous Every 12 hours 05/01/13 2107     05/02/13 0200  metroNIDAZOLE (FLAGYL) IVPB 500 mg     500 mg 100 mL/hr over 60 Minutes Intravenous Every 8 hours 05/01/13 2107     05/01/13 1845  ciprofloxacin (CIPRO) IVPB 400 mg     400 mg 200 mL/hr over 60 Minutes Intravenous  Once 05/01/13 1830 05/01/13 2048   05/01/13 1830  metroNIDAZOLE (FLAGYL) IVPB 500 mg     500 mg 100 mL/hr over 60 Minutes Intravenous  Once 05/01/13 1830 05/01/13 1949      Assessment/Plan: Impression: Perirectal abscess, slowly resolving. Still with leukocytosis. Plan: Patient initially wanted to be discharged home, but he now states he like to wait and see if his pain improves over the next 24  hours. Should he continue to have leukocytosis and fluctuance in the perirectal region, all taken to the OR for incision and drainage of the perirectal abscess.  LOS: 1 day    Bobby Porter A 05/02/2013

## 2013-05-02 NOTE — Progress Notes (Signed)
Utilization Review Complete  

## 2013-05-03 ENCOUNTER — Encounter (HOSPITAL_COMMUNITY)
Admission: EM | Disposition: A | Discharge status: Home / Self Care | Payer: Self-pay | Source: Home / Self Care | Attending: General Surgery

## 2013-05-03 ENCOUNTER — Inpatient Hospital Stay (HOSPITAL_COMMUNITY): Payer: BC Managed Care – PPO | Admitting: Anesthesiology

## 2013-05-03 ENCOUNTER — Encounter (HOSPITAL_COMMUNITY): Payer: Self-pay | Admitting: *Deleted

## 2013-05-03 ENCOUNTER — Encounter (HOSPITAL_COMMUNITY): Payer: BC Managed Care – PPO | Admitting: Anesthesiology

## 2013-05-03 HISTORY — PX: INCISION AND DRAINAGE PERIRECTAL ABSCESS: SHX1804

## 2013-05-03 LAB — SURGICAL PCR SCREEN
MRSA, PCR: NEGATIVE
Staphylococcus aureus: NEGATIVE

## 2013-05-03 LAB — CBC
HCT: 38.1 % — ABNORMAL LOW (ref 39.0–52.0)
Hemoglobin: 12.8 g/dL — ABNORMAL LOW (ref 13.0–17.0)
MCH: 29.8 pg (ref 26.0–34.0)
MCHC: 33.6 g/dL (ref 30.0–36.0)
MCV: 88.6 fL (ref 78.0–100.0)
Platelets: 381 10*3/uL (ref 150–400)
RBC: 4.3 MIL/uL (ref 4.22–5.81)
RDW: 11.9 % (ref 11.5–15.5)
WBC: 17.8 10*3/uL — AB (ref 4.0–10.5)

## 2013-05-03 SURGERY — INCISION AND DRAINAGE, ABSCESS, PERIRECTAL
Anesthesia: General

## 2013-05-03 MED ORDER — NALOXONE HCL 0.4 MG/ML IJ SOLN
INTRAMUSCULAR | Status: AC
  Filled 2013-05-03: qty 1

## 2013-05-03 MED ORDER — LACTATED RINGERS IV SOLN
INTRAVENOUS | Status: DC | PRN
  Administered 2013-05-03: 11:00:00 via INTRAVENOUS

## 2013-05-03 MED ORDER — ONDANSETRON HCL 4 MG/2ML IJ SOLN: 4.0000 mg | Freq: Once | INTRAMUSCULAR | Status: DC | PRN

## 2013-05-03 MED ORDER — PROPOFOL 10 MG/ML IV EMUL
INTRAVENOUS | Status: AC
  Filled 2013-05-03: qty 20

## 2013-05-03 MED ORDER — FENTANYL CITRATE 0.05 MG/ML IJ SOLN
INTRAMUSCULAR | Status: DC | PRN
  Administered 2013-05-03: 50 ug via INTRAVENOUS
  Administered 2013-05-03: 100 ug via INTRAVENOUS
  Administered 2013-05-03: 50 ug via INTRAVENOUS

## 2013-05-03 MED ORDER — LABETALOL HCL 5 MG/ML IV SOLN
INTRAVENOUS | Status: AC
  Filled 2013-05-03: qty 4

## 2013-05-03 MED ORDER — FENTANYL CITRATE 0.05 MG/ML IJ SOLN
INTRAMUSCULAR | Status: AC
  Filled 2013-05-03: qty 2

## 2013-05-03 MED ORDER — LACTATED RINGERS IV SOLN
INTRAVENOUS | Status: DC
  Administered 2013-05-03: 11:00:00 via INTRAVENOUS

## 2013-05-03 MED ORDER — KETOROLAC TROMETHAMINE 30 MG/ML IJ SOLN
30.0000 mg | Freq: Once | INTRAMUSCULAR | Status: AC
  Administered 2013-05-03: 30 mg via INTRAVENOUS
  Filled 2013-05-03: qty 1

## 2013-05-03 MED ORDER — ONDANSETRON HCL 4 MG/2ML IJ SOLN
4.0000 mg | Freq: Once | INTRAMUSCULAR | Status: AC
  Administered 2013-05-03: 4 mg via INTRAVENOUS
  Filled 2013-05-03: qty 2

## 2013-05-03 MED ORDER — PROPOFOL 10 MG/ML IV BOLUS
INTRAVENOUS | Status: AC
  Filled 2013-05-03: qty 20

## 2013-05-03 MED ORDER — SODIUM CHLORIDE BACTERIOSTATIC 0.9 % IJ SOLN
INTRAMUSCULAR | Status: AC
  Filled 2013-05-03: qty 10

## 2013-05-03 MED ORDER — MIDAZOLAM HCL 2 MG/2ML IJ SOLN
INTRAMUSCULAR | Status: AC
  Filled 2013-05-03: qty 2

## 2013-05-03 MED ORDER — GLYCOPYRROLATE 0.2 MG/ML IJ SOLN
0.2000 mg | Freq: Once | INTRAMUSCULAR | Status: AC
  Administered 2013-05-03: 0.2 mg via INTRAVENOUS
  Filled 2013-05-03: qty 1

## 2013-05-03 MED ORDER — PROPOFOL 10 MG/ML IV BOLUS
INTRAVENOUS | Status: DC | PRN
  Administered 2013-05-03: 200 mg via INTRAVENOUS

## 2013-05-03 MED ORDER — SODIUM CHLORIDE 0.9 % IJ SOLN
INTRAMUSCULAR | Status: AC
  Filled 2013-05-03: qty 10

## 2013-05-03 MED ORDER — LABETALOL HCL 5 MG/ML IV SOLN
INTRAVENOUS | Status: DC | PRN
  Administered 2013-05-03: 10 mg via INTRAVENOUS
  Administered 2013-05-03: 5 mg via INTRAVENOUS

## 2013-05-03 MED ORDER — FENTANYL CITRATE 0.05 MG/ML IJ SOLN
INTRAMUSCULAR | Status: AC
  Filled 2013-05-03: qty 5

## 2013-05-03 MED ORDER — ENOXAPARIN SODIUM 40 MG/0.4ML ~~LOC~~ SOLN: 40.0000 mg | SUBCUTANEOUS | Status: DC

## 2013-05-03 MED ORDER — LACTATED RINGERS IV SOLN
INTRAVENOUS | Status: DC
  Administered 2013-05-04: 06:00:00 via INTRAVENOUS

## 2013-05-03 MED ORDER — MIDAZOLAM HCL 2 MG/2ML IJ SOLN
1.0000 mg | INTRAMUSCULAR | Status: DC | PRN
  Administered 2013-05-03 (×2): 1 mg via INTRAVENOUS
  Filled 2013-05-03: qty 2

## 2013-05-03 MED ORDER — LABETALOL HCL 5 MG/ML IV SOLN
10.0000 mg | INTRAVENOUS | Status: DC | PRN
  Administered 2013-05-03: 10 mg via INTRAVENOUS

## 2013-05-03 MED ORDER — MIDAZOLAM HCL 5 MG/5ML IJ SOLN
INTRAMUSCULAR | Status: DC | PRN
  Administered 2013-05-03: 2 mg via INTRAVENOUS

## 2013-05-03 MED ORDER — 0.9 % SODIUM CHLORIDE (POUR BTL) OPTIME
TOPICAL | Status: DC | PRN
  Administered 2013-05-03: 1000 mL

## 2013-05-03 MED ORDER — FENTANYL CITRATE 0.05 MG/ML IJ SOLN
25.0000 ug | INTRAMUSCULAR | Status: DC | PRN
  Administered 2013-05-03: 50 ug via INTRAVENOUS
  Administered 2013-05-03 (×2): 25 ug via INTRAVENOUS

## 2013-05-03 MED ORDER — LIDOCAINE HCL (CARDIAC) 20 MG/ML IV SOLN
INTRAVENOUS | Status: DC | PRN
  Administered 2013-05-03: 50 mg via INTRAVENOUS

## 2013-05-03 MED ORDER — BUPIVACAINE HCL (PF) 0.5 % IJ SOLN
INTRAMUSCULAR | Status: AC
  Filled 2013-05-03: qty 30

## 2013-05-03 MED ORDER — FENTANYL CITRATE 0.05 MG/ML IJ SOLN
25.0000 ug | INTRAMUSCULAR | Status: AC
  Administered 2013-05-03 (×2): 25 ug via INTRAVENOUS
  Filled 2013-05-03: qty 2

## 2013-05-03 MED ORDER — METRONIDAZOLE IN NACL 5-0.79 MG/ML-% IV SOLN
INTRAVENOUS | Status: AC
  Filled 2013-05-03: qty 100

## 2013-05-03 MED ORDER — ARTIFICIAL TEARS OP OINT
TOPICAL_OINTMENT | OPHTHALMIC | Status: AC
  Filled 2013-05-03: qty 3.5

## 2013-05-03 MED ORDER — SEVOFLURANE IN SOLN
RESPIRATORY_TRACT | Status: AC
  Filled 2013-05-03: qty 250

## 2013-05-03 MED ORDER — LIDOCAINE HCL (PF) 1 % IJ SOLN
INTRAMUSCULAR | Status: AC
  Filled 2013-05-03: qty 5

## 2013-05-03 SURGICAL SUPPLY — 29 items
BAG HAMPER (MISCELLANEOUS) ×3 IMPLANT
CLOTH BEACON ORANGE TIMEOUT ST (SAFETY) ×3 IMPLANT
COVER LIGHT HANDLE STERIS (MISCELLANEOUS) ×6 IMPLANT
ELECT REM PT RETURN 9FT ADLT (ELECTROSURGICAL) ×3
ELECTRODE REM PT RTRN 9FT ADLT (ELECTROSURGICAL) ×1 IMPLANT
GAUZE PACKING IODOFORM 2 (PACKING) ×2 IMPLANT
GLOVE BIO SURGEON STRL SZ7.5 (GLOVE) ×3 IMPLANT
GLOVE BIOGEL PI IND STRL 7.0 (GLOVE) IMPLANT
GLOVE BIOGEL PI IND STRL 8.5 (GLOVE) IMPLANT
GLOVE BIOGEL PI INDICATOR 7.0 (GLOVE) ×2
GLOVE BIOGEL PI INDICATOR 8.5 (GLOVE) ×2
GLOVE ECLIPSE 8.5 STRL (GLOVE) ×2 IMPLANT
GLOVE EXAM NITRILE MD LF STRL (GLOVE) ×2 IMPLANT
GLOVE SS BIOGEL STRL SZ 6.5 (GLOVE) IMPLANT
GLOVE SUPERSENSE BIOGEL SZ 6.5 (GLOVE) ×2
GOWN STRL REUS W/TWL LRG LVL3 (GOWN DISPOSABLE) ×9 IMPLANT
KIT ROOM TURNOVER APOR (KITS) ×3 IMPLANT
MANIFOLD NEPTUNE II (INSTRUMENTS) ×3 IMPLANT
NDL HYPO 25X1 1.5 SAFETY (NEEDLE) ×1 IMPLANT
NEEDLE HYPO 25X1 1.5 SAFETY (NEEDLE) IMPLANT
NS IRRIG 1000ML POUR BTL (IV SOLUTION) ×3 IMPLANT
PACK MINOR (CUSTOM PROCEDURE TRAY) IMPLANT
PACK PERI GYN (CUSTOM PROCEDURE TRAY) ×2 IMPLANT
PAD ARMBOARD 7.5X6 YLW CONV (MISCELLANEOUS) ×3 IMPLANT
SET BASIN LINEN APH (SET/KITS/TRAYS/PACK) ×3 IMPLANT
SPONGE GAUZE 4X4 12PLY (GAUZE/BANDAGES/DRESSINGS) ×1 IMPLANT
SWAB CULTURE LIQ STUART DBL (MISCELLANEOUS) IMPLANT
SYR CONTROL 10ML LL (SYRINGE) ×3 IMPLANT
TOWEL OR 17X26 4PK STRL BLUE (TOWEL DISPOSABLE) ×1 IMPLANT

## 2013-05-03 NOTE — Preoperative (Signed)
Beta Blockers   Reason not to administer Beta Blockers:Not Applicable 

## 2013-05-03 NOTE — Progress Notes (Signed)
Still with perirectal pain and elevated white blood cell count. We'll proceed with incision and drainage of perirectal abscess today. The risks and benefits of the procedure were fully explained to the patient, who gave informed consent.

## 2013-05-03 NOTE — Transfer of Care (Signed)
Immediate Anesthesia Transfer of Care Note  Patient: Bobby Porter  Procedure(s) Performed: Procedure(s): IRRIGATION AND DEBRIDEMENT PERIRECTAL ABSCESS (N/A)  Patient Location: PACU  Anesthesia Type:General  Level of Consciousness: awake, alert  and oriented  Airway & Oxygen Therapy: Patient Spontanous Breathing  Post-op Assessment: Report given to PACU RN  Post vital signs: Reviewed and stable  Complications: No apparent anesthesia complications

## 2013-05-03 NOTE — Op Note (Signed)
Patient:  CARRIE SCHOONMAKER  DOB:  Oct 16, 1975  MRN:  [id number]   Preop Diagnosis:  Perirectal abscess  Postop Diagnosis:  Same  Procedure:  Incision and drainage of pararectal abscess  Surgeon:  Aviva Signs, M.D.  Anes:  General  Indications:  Patient is a 38 year old morbidly obese black male who presents with a perirectal abscess. The risks and benefits of the procedure were fully explained to the patient, who gave informed consent.  Procedure note:  The patient was placed in the lithotomy position after general anesthesia was administered. The perineum was prepped and draped using usual sterile technique with Betadine. Surgical site confirmation was performed.  On rectal examination, a large perirectal abscess was just outside the anus at the 3:00 position. This was incised in by mouth fluid was drained. This did track directly towards the dentate line. This appeared to track somewhat submuscularly in nature. There was no connection into the rectum. The wound was irrigated normal saline. Iodoform new gauze was then inserted into the abscess cavity. No other abnormalities were found. It appeared that the 2 cavities that were seen on CT scan were all in continuity.  All tape and needle counts were correct at the end of the procedure. Patient was awakened and transferred to PACU in stable condition.  Complications:  None  EBL:  Minimal  Specimen:  None

## 2013-05-03 NOTE — Anesthesia Postprocedure Evaluation (Signed)
  Anesthesia Post-op Note  Patient: Bobby Porter  Procedure(s) Performed: Procedure(s): IRRIGATION AND DEBRIDEMENT PERIRECTAL ABSCESS (N/A)  Patient Location: PACU  Anesthesia Type:General  Level of Consciousness: awake, alert  and oriented  Airway and Oxygen Therapy: Patient Spontanous Breathing and Patient connected to face mask oxygen  Post-op Pain: mild  Post-op Assessment: Post-op Vital signs reviewed, Patient's Cardiovascular Status Stable, Respiratory Function Stable, Patent Airway and No signs of Nausea or vomiting  Post-op Vital Signs: Reviewed and stable  Complications: No apparent anesthesia complications

## 2013-05-03 NOTE — Care Management Note (Signed)
UR completed 

## 2013-05-03 NOTE — Anesthesia Procedure Notes (Signed)
Procedure Name: LMA Insertion Date/Time: 05/03/2013 1:18 PM Performed by: Charmaine Downs Pre-anesthesia Checklist: Patient being monitored, Suction available, Emergency Drugs available and Patient identified Patient Re-evaluated:Patient Re-evaluated prior to inductionOxygen Delivery Method: Circle system utilized Preoxygenation: Pre-oxygenation with 100% oxygen Intubation Type: IV induction Ventilation: Mask ventilation without difficulty LMA: LMA inserted LMA Size: 5.0 Tube type: Oral Number of attempts: 1 Placement Confirmation: breath sounds checked- equal and bilateral and positive ETCO2 Tube secured with: Tape Dental Injury: Teeth and Oropharynx as per pre-operative assessment

## 2013-05-03 NOTE — Anesthesia Preprocedure Evaluation (Addendum)
Anesthesia Evaluation  Patient identified by MRN, date of birth, ID band Patient awake    Reviewed: Allergy & Precautions, H&P , NPO status , Patient's Chart, lab work & pertinent test results  Airway Mallampati: I TM Distance: >3 FB Neck ROM: Full    Dental  (+) Teeth Intact   Pulmonary Current Smoker,  breath sounds clear to auscultation        Cardiovascular hypertension (stopped meds 5 yrs ago), Rhythm:Regular Rate:Normal     Neuro/Psych    GI/Hepatic   Endo/Other  Morbid obesity  Renal/GU      Musculoskeletal   Abdominal   Peds  Hematology   Anesthesia Other Findings   Reproductive/Obstetrics                          Anesthesia Physical Anesthesia Plan  ASA: II  Anesthesia Plan: General   Post-op Pain Management:    Induction: Intravenous  Airway Management Planned: LMA  Additional Equipment:   Intra-op Plan:   Post-operative Plan: Extubation in OR  Informed Consent: I have reviewed the patients History and Physical, chart, labs and discussed the procedure including the risks, benefits and alternatives for the proposed anesthesia with the patient or authorized representative who has indicated his/her understanding and acceptance.     Plan Discussed with:   Anesthesia Plan Comments: (preop BP control - start labetolol.)        Anesthesia Quick Evaluation

## 2013-05-04 LAB — CBC
HEMATOCRIT: 35.9 % — AB (ref 39.0–52.0)
Hemoglobin: 11.9 g/dL — ABNORMAL LOW (ref 13.0–17.0)
MCH: 29.3 pg (ref 26.0–34.0)
MCHC: 33.1 g/dL (ref 30.0–36.0)
MCV: 88.4 fL (ref 78.0–100.0)
PLATELETS: 381 10*3/uL (ref 150–400)
RBC: 4.06 MIL/uL — AB (ref 4.22–5.81)
RDW: 11.8 % (ref 11.5–15.5)
WBC: 14.4 10*3/uL — ABNORMAL HIGH (ref 4.0–10.5)

## 2013-05-04 LAB — BASIC METABOLIC PANEL
BUN: 6 mg/dL (ref 6–23)
CALCIUM: 9 mg/dL (ref 8.4–10.5)
CO2: 29 mEq/L (ref 19–32)
Chloride: 100 mEq/L (ref 96–112)
Creatinine, Ser: 0.84 mg/dL (ref 0.50–1.35)
GFR calc Af Amer: >90 mL/min (ref >90)
GFR calc non Af Amer: >90 mL/min (ref >90)
Glucose, Bld: 99 mg/dL (ref 70–99)
Potassium: 3.6 mEq/L — ABNORMAL LOW (ref 3.7–5.3)
Sodium: 140 mEq/L (ref 137–147)

## 2013-05-04 MED ORDER — CIPROFLOXACIN HCL 500 MG PO TABS: 500.0000 mg | ORAL_TABLET | Freq: Two times a day (BID) | ORAL | Status: DC

## 2013-05-04 MED ORDER — HYDROCODONE-ACETAMINOPHEN 7.5-325 MG PO TABS: 1.0000 | ORAL_TABLET | ORAL | Status: DC | PRN

## 2013-05-04 MED ORDER — METRONIDAZOLE 500 MG PO TABS: 500.0000 mg | ORAL_TABLET | Freq: Three times a day (TID) | ORAL | Status: DC

## 2013-05-04 NOTE — Progress Notes (Signed)
Pt given d/c instructions and new prescriptions. Dose of IV cipro infusing at this time.  Discussed all home medications (when, how, and why to take), patient verbalizes understanding. Discussed home wound care with patient, teachback completed. F/U appointment to be made by patient Monday when offices open, pt is aware and states he will make and keep appointment. Pt is stable at this time awaiting transportation home.

## 2013-05-04 NOTE — Progress Notes (Signed)
Patient refused wheelchair, ambulated to main entrance with family, stable condition.

## 2013-05-04 NOTE — Discharge Summary (Signed)
Physician Discharge Summary  Patient ID: Bobby Porter MRN: [id number] DOB/AGE: 1975-10-18 38 y.o.  Admit date: 05/01/2013 Discharge date: 05/04/2013  Admission Diagnoses: Perirectal abscess  Discharge Diagnoses: Same Active Problems:   Perianal abscess   Discharged Condition: good  Hospital Course: Patient is a 38 year old black male who presented with several day history of worsening perirectal pain. He was found to have a leukocytosis while in the emergency room. CT scan of the abdomen and pelvis revealed a perirectal abscess with a smaller phlegmon in association with this. He was admitted to the hospital for IV antibiotics. Initially, he felt that it had drained, but his leukocytosis did not improve. He subsequently underwent incision and drainage of the perirectal abscess in the operating room on 05/03/2013. Tolerated the procedure well. His postoperative course has been unremarkable. His leukocytosis is resolving. He is being discharged home postoperative day one in good improving condition.  Treatments: surgery: Incision and drainage of perirectal abscess on 05/03/2013  Discharge Exam: Blood pressure 149/85, pulse 87, temperature 98.2 F (36.8 C), temperature source Oral, resp. rate 18, height 6\' 2"  (1.88 m), weight 144.6 kg (318 lb 12.6 oz), SpO2 95.00%. General appearance: alert, cooperative and no distress Resp: clear to auscultation bilaterally Cardio: regular rate and rhythm, S1, S2 normal, no murmur, click, rub or gallop Packing removed from perirectal abscess. Area healing well.  Disposition: 01-Home or Self Care     Medication List         ciprofloxacin 500 MG tablet  Commonly known as:  CIPRO  Take 1 tablet (500 mg total) by mouth 2 (two) times daily.     diazepam 5 MG tablet  Commonly known as:  VALIUM  Take 1 tablet by mouth 3 (three) times daily as needed. Muscle spasm     HYDROcodone-acetaminophen 7.5-325 MG per tablet  Commonly known as:  NORCO   Take 1-2 tablets by mouth every 4 (four) hours as needed for moderate pain.     meloxicam 15 MG tablet  Commonly known as:  MOBIC  Take 1 tablet (15 mg total) by mouth daily.     metroNIDAZOLE 500 MG tablet  Commonly known as:  FLAGYL  Take 1 tablet (500 mg total) by mouth 3 (three) times daily.     testosterone cypionate 200 MG/ML injection  Commonly known as:  DEPOTESTOTERONE CYPIONATE  Inject 200 mg into the muscle every 21 ( twenty-one) days.           Follow-up Information   Follow up with Jamesetta So, MD. Schedule an appointment as soon as possible for a visit on 05/09/2013.   Specialty:  General Surgery   Contact information:   [id number] [address] [phone]       Signed: Aviva Signs A 05/04/2013, 7:49 AM

## 2013-05-04 NOTE — Discharge Instructions (Signed)
Peri-Rectal Abscess °Your caregiver has diagnosed you as having a peri-rectal abscess. This is an infected area near the rectum that is filled with pus. If the abscess is near the surface of the skin, your caregiver may open (incise) the area and drain the pus. °HOME CARE INSTRUCTIONS  °· If your abscess was opened up and drained. A small piece of gauze may be placed in the opening so that it can drain. Do not remove the gauze unless directed by your caregiver. °· A loose dressing may be placed over the abscess site. Change the dressing as often as necessary to keep it clean and dry. °· After the drain is removed, the area may be washed with a gentle antiseptic (soap) four times per day. °· A warm sitz bath, warm packs or heating pad may be used for pain relief, taking care not to burn yourself. °· Return for a wound check in 1 day or as directed. °· An "inflatable doughnut" may be used for sitting with added comfort. These can be purchased at a drugstore or medical supply house. °· To reduce pain and straining with bowel movements, eat a high fiber diet with plenty of fruits and vegetables. Use stool softeners as recommended by your caregiver. This is especially important if narcotic type pain medications were prescribed as these may cause marked constipation. °· Only take over-the-counter or prescription medicines for pain, discomfort, or fever as directed by your caregiver. °SEEK IMMEDIATE MEDICAL CARE IF:  °· You have increasing pain that is not controlled by medication. °· There is increased inflammation (redness), swelling, bleeding, or drainage from the area. °· An oral temperature above 102° F (38.9° C) develops. °· You develop chills or generalized malaise (feel lethargic or feel "washed out"). °· You develop any new symptoms (problems) you feel may be related to your present problem. °Document Released: 01/22/2000 Document Revised: 04/18/2011 Document Reviewed: 01/22/2008 °ExitCare® Patient Information  ©2014 [employer]. ° °

## 2013-05-07 ENCOUNTER — Encounter (HOSPITAL_COMMUNITY): Payer: Self-pay | Admitting: General Surgery

## 2015-05-29 ENCOUNTER — Other Ambulatory Visit (HOSPITAL_COMMUNITY): Payer: Self-pay | Admitting: Internal Medicine

## 2015-05-29 DIAGNOSIS — E229 Hyperfunction of pituitary gland, unspecified: Secondary | ICD-10-CM

## 2015-05-29 DIAGNOSIS — H538 Other visual disturbances: Secondary | ICD-10-CM

## 2015-05-29 DIAGNOSIS — R7989 Other specified abnormal findings of blood chemistry: Secondary | ICD-10-CM

## 2015-06-05 ENCOUNTER — Other Ambulatory Visit (HOSPITAL_COMMUNITY): Payer: Self-pay

## 2015-09-17 ENCOUNTER — Encounter (HOSPITAL_COMMUNITY): Payer: Self-pay | Admitting: Emergency Medicine

## 2015-09-17 ENCOUNTER — Emergency Department (HOSPITAL_COMMUNITY)
Admission: EM | Admit: 2015-09-17 | Discharge: 2015-09-18 | Disposition: A | Discharge status: Home / Self Care | Payer: BLUE CROSS/BLUE SHIELD | Attending: Emergency Medicine | Admitting: Emergency Medicine

## 2015-09-17 DIAGNOSIS — F17200 Nicotine dependence, unspecified, uncomplicated: Secondary | ICD-10-CM | POA: Insufficient documentation

## 2015-09-17 DIAGNOSIS — R1013 Epigastric pain: Secondary | ICD-10-CM | POA: Diagnosis present

## 2015-09-17 DIAGNOSIS — D573 Sickle-cell trait: Secondary | ICD-10-CM | POA: Diagnosis not present

## 2015-09-17 DIAGNOSIS — I10 Essential (primary) hypertension: Secondary | ICD-10-CM | POA: Diagnosis not present

## 2015-09-17 HISTORY — DX: Neoplasm of unspecified behavior of endocrine glands and other parts of nervous system: D49.7

## 2015-09-17 LAB — CBC WITH DIFFERENTIAL/PLATELET
BASOS PCT: 0 %
Basophils Absolute: 0 10*3/uL (ref 0.0–0.1)
EOS PCT: 5 %
Eosinophils Absolute: 0.6 10*3/uL (ref 0.0–0.7)
HCT: 36.7 % — ABNORMAL LOW (ref 39.0–52.0)
HEMOGLOBIN: 12.2 g/dL — AB (ref 13.0–17.0)
Lymphocytes Relative: 39 %
Lymphs Abs: 4.5 10*3/uL — ABNORMAL HIGH (ref 0.7–4.0)
MCH: 29.8 pg (ref 26.0–34.0)
MCHC: 33.2 g/dL (ref 30.0–36.0)
MCV: 89.5 fL (ref 78.0–100.0)
Monocytes Absolute: 0.9 10*3/uL (ref 0.1–1.0)
Monocytes Relative: 8 %
NEUTROS ABS: 5.5 10*3/uL (ref 1.7–7.7)
Neutrophils Relative %: 48 %
PLATELETS: 359 10*3/uL (ref 150–400)
RBC: 4.1 MIL/uL — ABNORMAL LOW (ref 4.22–5.81)
RDW: 12.2 % (ref 11.5–15.5)
WBC: 11.5 10*3/uL — ABNORMAL HIGH (ref 4.0–10.5)

## 2015-09-17 MED ORDER — GI COCKTAIL ~~LOC~~
30.0000 mL | Freq: Once | ORAL | Status: AC
  Administered 2015-09-17: 30 mL via ORAL
  Filled 2015-09-17: qty 30

## 2015-09-17 MED ORDER — ONDANSETRON HCL 4 MG/2ML IJ SOLN
4.0000 mg | Freq: Once | INTRAMUSCULAR | Status: AC
  Administered 2015-09-17: 4 mg via INTRAVENOUS
  Filled 2015-09-17: qty 2

## 2015-09-17 NOTE — ED Notes (Signed)
Pt reports abdominal pain worsens after meals.

## 2015-09-17 NOTE — ED Triage Notes (Signed)
Patient complaining of upper middle abdominal pain for over a month. Denies vomiting, complains of nausea.

## 2015-09-17 NOTE — ED Provider Notes (Signed)
[facility] DEPT Provider Note   CSN: [id number] Arrival date & time: 09/17/15  2126  First Provider Contact:  First MD Initiated Contact with Patient 09/17/15 2313     By signing my name below, I, Shanna Cisco, attest that this documentation has been prepared under the direction and in the presence of Delora Fuel, MD. Electronically Signed: Shanna Cisco, ED Scribe. 09/17/15. 11:24 PM.   History   Chief Complaint Chief Complaint  Patient presents with  . Abdominal Pain     The history is provided by the patient. No language interpreter was used.   HPI Comments:  Bobby Porter is a 40 y.o. male who presents to the Emergency Department complaining of intermittent epigastric abdominal pain, which started about a month ago. Pt rates pain a 6/10. Associated symptoms include nausea and occasional back pain. Pain is exacerbated occasionally after eating, but it does not matter what he eats. Pt has tried taking Tums, with no relief, and takes Ibuprofen prn. Denies vomiting, radiation of pain. Pt has a hx of tobacco use and smokes a pack per week.    Past Medical History:  Diagnosis Date  . Hypertension   . 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 Perianal abscess 05/01/2013    Past Surgical History:  Procedure Laterality Date  . INCISION AND DRAINAGE PERIRECTAL ABSCESS N/A 05/03/2013   Procedure: IRRIGATION AND DEBRIDEMENT PERIRECTAL ABSCESS;  Surgeon: Jamesetta So, MD;  Location: [facility];  Service: General;  Laterality: N/A;       Home Medications    Prior to Admission medications   Medication Sig Start Date End Date Taking? Authorizing Provider  cabergoline (DOSTINEX) 0.5 MG tablet Take 0.25 mg by mouth 2 (two) times a week. Takes on Tuesday and Saturday.   Yes Historical Provider, MD  PREGNYL 60454 units injection INJECT 1000 UNITS INTRAMUSCULARLY ON MONDAY, [facility] AND FRIDAY 08/17/15  Yes Historical Provider, MD    Family  History History reviewed. No pertinent family history.  Social History Social History  Substance Use Topics  . Smoking status: Current Some Day Smoker  . Smokeless tobacco: Never Used  . Alcohol use Yes     Comment: rarely     Allergies   Review of patient's allergies indicates no known allergies.   Review of Systems Review of Systems  Gastrointestinal: Positive for abdominal pain and nausea. Negative for vomiting.  Musculoskeletal: Positive for back pain.  All other systems reviewed and are negative.    Physical Exam Updated Vital Signs BP 156/92 (BP Location: Left Arm)   Pulse 62   Temp 98.3 F (36.8 C) (Oral)   Resp 17   Ht 6\' 2"  (1.88 m)   Wt (!) 325 lb (147.4 kg)   SpO2 99%   BMI 41.73 kg/m   Physical Exam  Constitutional: He is oriented to person, place, and time. He appears well-developed and well-nourished.  Obese  HENT:  Head: Normocephalic and atraumatic.  Eyes: EOM are normal. Pupils are equal, round, and reactive to light.  Neck: Normal range of motion. Neck supple. No JVD present.  Cardiovascular: Normal rate, regular rhythm and normal heart sounds.   No murmur heard. Pulmonary/Chest: Effort normal and breath sounds normal. He has no wheezes. He has no rales. He exhibits no tenderness.  Abdominal: Soft. He exhibits no distension and no mass. There is tenderness.  Moderate tenderness epigastric and RUQ with positive Murphy's sign. Bowel signs decreased.  Musculoskeletal: Normal range of motion. He exhibits no edema.  Lymphadenopathy:    He has no cervical adenopathy.  Neurological: He is alert and oriented to person, place, and time. No cranial nerve deficit. He exhibits normal muscle tone. Coordination normal.  Skin: Skin is warm and dry. No rash noted.  Psychiatric: He has a normal mood and affect. His behavior is normal. Judgment and thought content normal.  Nursing note and vitals reviewed.    ED Treatments / Results  DIAGNOSTIC  STUDIES:  Oxygen Saturation is 99% on room air, normal by my interpretation.    COORDINATION OF CARE:  11:16 PM Discussed treatment plan with pt at bedside, which includes blood work and anti-nausea and pain medication, and pt agreed to plan.  Labs (all labs ordered are listed, but only abnormal results are displayed) Labs Reviewed  COMPREHENSIVE METABOLIC PANEL - Abnormal; Notable for the following:       Result Value   Glucose, Bld 104 (*)    All other components within normal limits  CBC WITH DIFFERENTIAL/PLATELET - Abnormal; Notable for the following:    WBC 11.5 (*)    RBC 4.10 (*)    Hemoglobin 12.2 (*)    HCT 36.7 (*)    Lymphs Abs 4.5 (*)    All other components within normal limits  LIPASE, BLOOD    Procedures Procedures (including critical care time)  Medications Ordered in ED Medications - No data to display   Initial Impression / Assessment and Plan / ED Course  I have reviewed the triage vital signs and the nursing notes.  Pertinent labs & imaging results that were available during my care of the patient were reviewed by me and considered in my medical decision making (see chart for details).  Clinical Course    Epigastric pain which is predominantly postprandial. Consider gastric ulcer, consider biliary colic. Screening labs are ordered. Patient is somewhat oppositional to having any testing done in stating that he had these tests done in the not too distant past. Review of old records shows no recent lab work done within the [employer] system. Review of care everywhere shows he is being evaluated at [facility] for hypogonadism and he did have a [facility] of metabolic panel on March 31 which was unremarkable. However, this predates his current complaints. He'll be given a therapeutic trial of a GI cocktail.  He had temporary relief of pain with GI cocktail. Is given a dose of pantoprazole and a single dose of oxycodone and acetaminophen. Laboratory workup  shows mild anemia which is unchanged from baseline. Transaminases are normal as is alkaline phosphatase and bilirubin. Most likely diagnosis at this point is gastric ulcer. He will be brought back in the morning for gallbladder ultrasound. He is given a prescription for pantoprazole and is instructed to follow-up with PCP in one week.  Final Clinical Impressions(s) / ED Diagnoses   Final diagnoses:  Epigastric pain  Sickle cell trait (HCC)    New Prescriptions New Prescriptions   PANTOPRAZOLE (PROTONIX) 40 MG TABLET    Take 1 tablet (40 mg total) by mouth daily.  I personally performed the services described in this documentation, which was scribed in my presence. The recorded information has been reviewed and is accurate.       Delora Fuel, MD [id number]

## 2015-09-18 ENCOUNTER — Ambulatory Visit (HOSPITAL_COMMUNITY)
Admit: 2015-09-18 | Discharge: 2015-09-18 | Disposition: A | Discharge status: Home / Self Care | Payer: BLUE CROSS/BLUE SHIELD | Attending: Emergency Medicine | Admitting: Emergency Medicine

## 2015-09-18 DIAGNOSIS — K8020 Calculus of gallbladder without cholecystitis without obstruction: Secondary | ICD-10-CM | POA: Insufficient documentation

## 2015-09-18 DIAGNOSIS — R109 Unspecified abdominal pain: Secondary | ICD-10-CM

## 2015-09-18 LAB — LIPASE, BLOOD: Lipase: 34 U/L (ref 11–51)

## 2015-09-18 LAB — COMPREHENSIVE METABOLIC PANEL
ALBUMIN: 3.7 g/dL (ref 3.5–5.0)
ALT: 26 U/L (ref 17–63)
AST: 21 U/L (ref 15–41)
Alkaline Phosphatase: 96 U/L (ref 38–126)
Anion gap: 5 (ref 5–15)
BILIRUBIN TOTAL: 0.5 mg/dL (ref 0.3–1.2)
BUN: 6 mg/dL (ref 6–20)
CHLORIDE: 103 mmol/L (ref 101–111)
CO2: 29 mmol/L (ref 22–32)
Calcium: 8.9 mg/dL (ref 8.9–10.3)
Creatinine, Ser: 0.84 mg/dL (ref 0.61–1.24)
GFR calc Af Amer: >60 mL/min (ref >60)
GFR calc non Af Amer: >60 mL/min (ref >60)
GLUCOSE: 104 mg/dL — AB (ref 65–99)
Potassium: 3.7 mmol/L (ref 3.5–5.1)
Sodium: 137 mmol/L (ref 135–145)
Total Protein: 6.9 g/dL (ref 6.5–8.1)

## 2015-09-18 MED ORDER — PANTOPRAZOLE SODIUM 40 MG PO TBEC
40.0000 mg | DELAYED_RELEASE_TABLET | Freq: Once | ORAL | Status: AC
  Administered 2015-09-18: 40 mg via ORAL
  Filled 2015-09-18: qty 1

## 2015-09-18 MED ORDER — PANTOPRAZOLE SODIUM 40 MG PO TBEC: 40.0000 mg | DELAYED_RELEASE_TABLET | Freq: Every day | ORAL | 0 refills | Status: DC

## 2015-09-18 MED ORDER — OXYCODONE-ACETAMINOPHEN 5-325 MG PO TABS
1.0000 | ORAL_TABLET | Freq: Once | ORAL | Status: AC
Start: 2015-09-18 — End: 2015-09-18
  Administered 2015-09-18: 1 via ORAL
  Filled 2015-09-18: qty 1

## 2015-12-16 ENCOUNTER — Ambulatory Visit: Payer: Self-pay | Admitting: Physician Assistant

## 2015-12-16 ENCOUNTER — Encounter (HOSPITAL_COMMUNITY): Payer: Self-pay | Admitting: *Deleted

## 2015-12-16 MED ORDER — DEXTROSE 5 % IV SOLN
3.0000 g | INTRAVENOUS | Status: AC
  Administered 2015-12-17: 3 g via INTRAVENOUS
  Filled 2015-12-16: qty 3000

## 2015-12-16 NOTE — Progress Notes (Signed)
Spoke with pt for pre-op call. Pt denies cardiac history, chest pain or sob. Instructed pt not to smoke as of now until after surgery.

## 2015-12-17 ENCOUNTER — Ambulatory Visit (HOSPITAL_COMMUNITY): Payer: BLUE CROSS/BLUE SHIELD | Admitting: Certified Registered Nurse Anesthetist

## 2015-12-17 ENCOUNTER — Observation Stay (HOSPITAL_COMMUNITY)
Admission: RE | Admit: 2015-12-17 | Discharge: 2015-12-18 | Disposition: A | Discharge status: Home / Self Care | Payer: BLUE CROSS/BLUE SHIELD | Source: Ambulatory Visit | Attending: Orthopedic Surgery | Admitting: Orthopedic Surgery

## 2015-12-17 ENCOUNTER — Encounter (HOSPITAL_COMMUNITY)
Admission: RE | Disposition: A | Discharge status: Home / Self Care | Payer: Self-pay | Source: Ambulatory Visit | Attending: Orthopedic Surgery

## 2015-12-17 ENCOUNTER — Ambulatory Visit (HOSPITAL_COMMUNITY): Payer: BLUE CROSS/BLUE SHIELD

## 2015-12-17 ENCOUNTER — Encounter (HOSPITAL_COMMUNITY): Payer: Self-pay | Admitting: Certified Registered Nurse Anesthetist

## 2015-12-17 DIAGNOSIS — M4807 Spinal stenosis, lumbosacral region: Secondary | ICD-10-CM | POA: Diagnosis not present

## 2015-12-17 DIAGNOSIS — F17210 Nicotine dependence, cigarettes, uncomplicated: Secondary | ICD-10-CM | POA: Insufficient documentation

## 2015-12-17 DIAGNOSIS — Z419 Encounter for procedure for purposes other than remedying health state, unspecified: Secondary | ICD-10-CM

## 2015-12-17 DIAGNOSIS — Z79899 Other long term (current) drug therapy: Secondary | ICD-10-CM | POA: Insufficient documentation

## 2015-12-17 DIAGNOSIS — M5117 Intervertebral disc disorders with radiculopathy, lumbosacral region: Secondary | ICD-10-CM | POA: Diagnosis present

## 2015-12-17 DIAGNOSIS — I10 Essential (primary) hypertension: Secondary | ICD-10-CM | POA: Insufficient documentation

## 2015-12-17 DIAGNOSIS — M48061 Spinal stenosis, lumbar region without neurogenic claudication: Secondary | ICD-10-CM | POA: Diagnosis present

## 2015-12-17 HISTORY — DX: Gastro-esophageal reflux disease without esophagitis: K21.9

## 2015-12-17 HISTORY — PX: HEMI-MICRODISCECTOMY LUMBAR LAMINECTOMY LEVEL 1: SHX5846

## 2015-12-17 HISTORY — DX: Anxiety disorder, unspecified: F41.9

## 2015-12-17 HISTORY — DX: Personal history of urinary calculi: Z87.442

## 2015-12-17 HISTORY — DX: Major depressive disorder, single episode, unspecified: F32.9

## 2015-12-17 HISTORY — DX: Depression, unspecified: F32.A

## 2015-12-17 HISTORY — DX: Calculus of gallbladder without cholecystitis without obstruction: K80.20

## 2015-12-17 LAB — BASIC METABOLIC PANEL
ANION GAP: 5 (ref 5–15)
BUN: 7 mg/dL (ref 6–20)
CALCIUM: 8.9 mg/dL (ref 8.9–10.3)
CO2: 26 mmol/L (ref 22–32)
Chloride: 107 mmol/L (ref 101–111)
Creatinine, Ser: 0.83 mg/dL (ref 0.61–1.24)
Glucose, Bld: 90 mg/dL (ref 65–99)
Potassium: 3.6 mmol/L (ref 3.5–5.1)
SODIUM: 138 mmol/L (ref 135–145)

## 2015-12-17 LAB — CBC
HCT: 38.9 % — ABNORMAL LOW (ref 39.0–52.0)
HEMOGLOBIN: 12.7 g/dL — AB (ref 13.0–17.0)
MCH: 28.6 pg (ref 26.0–34.0)
MCHC: 32.6 g/dL (ref 30.0–36.0)
MCV: 87.6 fL (ref 78.0–100.0)
PLATELETS: 363 10*3/uL (ref 150–400)
RBC: 4.44 MIL/uL (ref 4.22–5.81)
RDW: 12.5 % (ref 11.5–15.5)
WBC: 6.7 10*3/uL (ref 4.0–10.5)

## 2015-12-17 LAB — SURGICAL PCR SCREEN
MRSA, PCR: NEGATIVE
STAPHYLOCOCCUS AUREUS: NEGATIVE

## 2015-12-17 SURGERY — HEMI-MICRODISCECTOMY LUMBAR LAMINECTOMY LEVEL 1
Anesthesia: General | Laterality: Bilateral

## 2015-12-17 MED ORDER — DEXAMETHASONE SODIUM PHOSPHATE 10 MG/ML IJ SOLN
INTRAMUSCULAR | Status: AC
Start: 2015-12-17 — End: 2015-12-17
  Filled 2015-12-17: qty 1

## 2015-12-17 MED ORDER — ARTIFICIAL TEARS OP OINT
TOPICAL_OINTMENT | OPHTHALMIC | Status: DC | PRN
  Administered 2015-12-17: 1 via OPHTHALMIC

## 2015-12-17 MED ORDER — AMLODIPINE BESYLATE 5 MG PO TABS
5.0000 mg | ORAL_TABLET | Freq: Every day | ORAL | Status: DC
  Administered 2015-12-18: 5 mg via ORAL
  Filled 2015-12-17: qty 1

## 2015-12-17 MED ORDER — PHENOL 1.4 % MT LIQD: 1.0000 | OROMUCOSAL | Status: DC | PRN

## 2015-12-17 MED ORDER — METHOCARBAMOL 1000 MG/10ML IJ SOLN
500.0000 mg | Freq: Four times a day (QID) | INTRAVENOUS | Status: DC | PRN
  Filled 2015-12-17: qty 5

## 2015-12-17 MED ORDER — KETAMINE HCL 10 MG/ML IJ SOLN
INTRAMUSCULAR | Status: DC | PRN
  Administered 2015-12-17: 50 mg via INTRAVENOUS

## 2015-12-17 MED ORDER — METOPROLOL TARTRATE 5 MG/5ML IV SOLN
INTRAVENOUS | Status: AC
Start: 2015-12-17 — End: 2015-12-17
  Filled 2015-12-17: qty 5

## 2015-12-17 MED ORDER — LIDOCAINE 2% (20 MG/ML) 5 ML SYRINGE
INTRAMUSCULAR | Status: AC
  Filled 2015-12-17: qty 5

## 2015-12-17 MED ORDER — HEMOSTATIC AGENTS (NO CHARGE) OPTIME
TOPICAL | Status: DC | PRN
  Administered 2015-12-17: 1 via TOPICAL

## 2015-12-17 MED ORDER — 0.9 % SODIUM CHLORIDE (POUR BTL) OPTIME
TOPICAL | Status: DC | PRN
  Administered 2015-12-17 (×2): 1000 mL

## 2015-12-17 MED ORDER — MIDAZOLAM HCL 2 MG/2ML IJ SOLN
INTRAMUSCULAR | Status: DC | PRN
  Administered 2015-12-17: 2 mg via INTRAVENOUS

## 2015-12-17 MED ORDER — THROMBIN 20000 UNITS EX SOLR
CUTANEOUS | Status: AC
  Filled 2015-12-17: qty 20000

## 2015-12-17 MED ORDER — ONDANSETRON HCL 4 MG/2ML IJ SOLN
INTRAMUSCULAR | Status: DC | PRN
  Administered 2015-12-17: 4 mg via INTRAVENOUS

## 2015-12-17 MED ORDER — LIDOCAINE-EPINEPHRINE (PF) 1 %-1:200000 IJ SOLN
INTRAMUSCULAR | Status: AC
  Filled 2015-12-17: qty 30

## 2015-12-17 MED ORDER — ONDANSETRON HCL 4 MG PO TABS: 4.0000 mg | ORAL_TABLET | Freq: Three times a day (TID) | ORAL | 0 refills | Status: AC | PRN

## 2015-12-17 MED ORDER — DEXAMETHASONE 4 MG PO TABS
4.0000 mg | ORAL_TABLET | Freq: Four times a day (QID) | ORAL | Status: DC
  Administered 2015-12-17 – 2015-12-18 (×2): 4 mg via ORAL
  Filled 2015-12-17 (×2): qty 1

## 2015-12-17 MED ORDER — SODIUM CHLORIDE 0.9% FLUSH: 3.0000 mL | Freq: Two times a day (BID) | INTRAVENOUS | Status: DC

## 2015-12-17 MED ORDER — SODIUM CHLORIDE 0.9% FLUSH: 3.0000 mL | INTRAVENOUS | Status: DC | PRN

## 2015-12-17 MED ORDER — DEXTROSE 5 % IV SOLN
INTRAVENOUS | Status: DC | PRN
  Administered 2015-12-17: 25 ug/min via INTRAVENOUS

## 2015-12-17 MED ORDER — METHOCARBAMOL 500 MG PO TABS: 500.0000 mg | ORAL_TABLET | Freq: Three times a day (TID) | ORAL | 0 refills | Status: AC | PRN

## 2015-12-17 MED ORDER — FENTANYL CITRATE (PF) 100 MCG/2ML IJ SOLN
INTRAMUSCULAR | Status: AC
  Filled 2015-12-17: qty 2

## 2015-12-17 MED ORDER — OXYCODONE HCL 5 MG PO TABS
10.0000 mg | ORAL_TABLET | ORAL | Status: DC | PRN
  Administered 2015-12-17 – 2015-12-18 (×3): 10 mg via ORAL
  Filled 2015-12-17 (×3): qty 2

## 2015-12-17 MED ORDER — LACTATED RINGERS IV SOLN
INTRAVENOUS | Status: DC
  Administered 2015-12-17 (×4): via INTRAVENOUS

## 2015-12-17 MED ORDER — SUGAMMADEX SODIUM 200 MG/2ML IV SOLN
INTRAVENOUS | Status: DC | PRN
  Administered 2015-12-17: 300 mg via INTRAVENOUS

## 2015-12-17 MED ORDER — SODIUM CHLORIDE 0.9 % IV SOLN: 250.0000 mL | INTRAVENOUS | Status: DC

## 2015-12-17 MED ORDER — CEFAZOLIN SODIUM 1 G IJ SOLR
INTRAMUSCULAR | Status: AC
Start: 2015-12-17 — End: 2015-12-17
  Filled 2015-12-17: qty 20

## 2015-12-17 MED ORDER — KETAMINE HCL-SODIUM CHLORIDE 100-0.9 MG/10ML-% IV SOSY
PREFILLED_SYRINGE | INTRAVENOUS | Status: AC
  Filled 2015-12-17: qty 10

## 2015-12-17 MED ORDER — GLYCOPYRROLATE 0.2 MG/ML IV SOSY
PREFILLED_SYRINGE | INTRAVENOUS | Status: AC
  Filled 2015-12-17: qty 3

## 2015-12-17 MED ORDER — LIDOCAINE HCL (CARDIAC) 20 MG/ML IV SOLN
INTRAVENOUS | Status: DC | PRN
  Administered 2015-12-17: 60 mg via INTRATRACHEAL
  Administered 2015-12-17: 100 mg via INTRATRACHEAL

## 2015-12-17 MED ORDER — OXYCODONE-ACETAMINOPHEN 10-325 MG PO TABS: 1.0000 | ORAL_TABLET | ORAL | 0 refills | Status: AC | PRN

## 2015-12-17 MED ORDER — METHOCARBAMOL 500 MG PO TABS
500.0000 mg | ORAL_TABLET | Freq: Four times a day (QID) | ORAL | Status: DC | PRN
  Administered 2015-12-17 – 2015-12-18 (×3): 500 mg via ORAL
  Filled 2015-12-17 (×3): qty 1

## 2015-12-17 MED ORDER — MENTHOL 3 MG MT LOZG: 1.0000 | LOZENGE | OROMUCOSAL | Status: DC | PRN

## 2015-12-17 MED ORDER — ONDANSETRON HCL 4 MG/2ML IJ SOLN
4.0000 mg | INTRAMUSCULAR | Status: DC | PRN
  Administered 2015-12-17: 4 mg via INTRAVENOUS
  Filled 2015-12-17: qty 2

## 2015-12-17 MED ORDER — PROPOFOL 10 MG/ML IV BOLUS
INTRAVENOUS | Status: AC
  Filled 2015-12-17: qty 20

## 2015-12-17 MED ORDER — THROMBIN 20000 UNITS EX KIT
PACK | CUTANEOUS | Status: DC | PRN
  Administered 2015-12-17: 20000 [IU] via TOPICAL

## 2015-12-17 MED ORDER — LACTATED RINGERS IV SOLN: INTRAVENOUS | Status: DC

## 2015-12-17 MED ORDER — PROPOFOL 10 MG/ML IV BOLUS
INTRAVENOUS | Status: DC | PRN
  Administered 2015-12-17: 200 mg via INTRAVENOUS

## 2015-12-17 MED ORDER — ACETAMINOPHEN 10 MG/ML IV SOLN
INTRAVENOUS | Status: AC
  Filled 2015-12-17: qty 100

## 2015-12-17 MED ORDER — DEXAMETHASONE SODIUM PHOSPHATE 10 MG/ML IJ SOLN
INTRAMUSCULAR | Status: DC | PRN
  Administered 2015-12-17: 10 mg via INTRAVENOUS

## 2015-12-17 MED ORDER — ARTIFICIAL TEARS OP OINT
TOPICAL_OINTMENT | OPHTHALMIC | Status: AC
  Filled 2015-12-17: qty 3.5

## 2015-12-17 MED ORDER — MIDAZOLAM HCL 2 MG/2ML IJ SOLN
INTRAMUSCULAR | Status: AC
  Filled 2015-12-17: qty 2

## 2015-12-17 MED ORDER — GLYCOPYRROLATE 0.2 MG/ML IJ SOLN
INTRAMUSCULAR | Status: DC | PRN
  Administered 2015-12-17: 0.2 mg via INTRAVENOUS

## 2015-12-17 MED ORDER — DEXAMETHASONE SODIUM PHOSPHATE 4 MG/ML IJ SOLN
4.0000 mg | Freq: Four times a day (QID) | INTRAMUSCULAR | Status: DC
  Administered 2015-12-17: 4 mg via INTRAVENOUS
  Filled 2015-12-17: qty 1

## 2015-12-17 MED ORDER — CABERGOLINE 0.5 MG PO TABS: 0.2500 mg | ORAL_TABLET | ORAL | Status: DC

## 2015-12-17 MED ORDER — ONDANSETRON HCL 4 MG/2ML IJ SOLN
INTRAMUSCULAR | Status: AC
Start: 2015-12-17 — End: 2015-12-17
  Filled 2015-12-17: qty 2

## 2015-12-17 MED ORDER — PROPOFOL 1000 MG/100ML IV EMUL
INTRAVENOUS | Status: AC
  Filled 2015-12-17: qty 100

## 2015-12-17 MED ORDER — CEFAZOLIN IN D5W 1 GM/50ML IV SOLN
1.0000 g | Freq: Three times a day (TID) | INTRAVENOUS | Status: AC
  Administered 2015-12-17 – 2015-12-18 (×2): 1 g via INTRAVENOUS
  Filled 2015-12-17 (×2): qty 50

## 2015-12-17 MED ORDER — ROCURONIUM BROMIDE 100 MG/10ML IV SOLN
INTRAVENOUS | Status: DC | PRN
  Administered 2015-12-17: 60 mg via INTRAVENOUS
  Administered 2015-12-17 (×2): 10 mg via INTRAVENOUS

## 2015-12-17 MED ORDER — MUPIROCIN 2 % EX OINT
1.0000 "application " | TOPICAL_OINTMENT | Freq: Once | CUTANEOUS | Status: AC
  Administered 2015-12-17: 1 via TOPICAL
  Filled 2015-12-17: qty 22

## 2015-12-17 MED ORDER — MORPHINE SULFATE (PF) 4 MG/ML IV SOLN: 1.0000 mg | INTRAVENOUS | Status: DC | PRN

## 2015-12-17 MED ORDER — ACETAMINOPHEN 10 MG/ML IV SOLN
INTRAVENOUS | Status: DC | PRN
  Administered 2015-12-17: 1000 mg via INTRAVENOUS

## 2015-12-17 MED ORDER — HYDROMORPHONE HCL 1 MG/ML IJ SOLN
0.2500 mg | INTRAMUSCULAR | Status: DC | PRN
  Administered 2015-12-17 (×2): 0.5 mg via INTRAVENOUS

## 2015-12-17 MED ORDER — SUGAMMADEX SODIUM 200 MG/2ML IV SOLN
INTRAVENOUS | Status: AC
  Filled 2015-12-17: qty 2

## 2015-12-17 MED ORDER — LABETALOL HCL 5 MG/ML IV SOLN
INTRAVENOUS | Status: AC
  Filled 2015-12-17: qty 4

## 2015-12-17 MED ORDER — LIDOCAINE-EPINEPHRINE (PF) 1 %-1:200000 IJ SOLN
INTRAMUSCULAR | Status: DC | PRN
  Administered 2015-12-17: 10 mL

## 2015-12-17 MED ORDER — METOCLOPRAMIDE HCL 5 MG/ML IJ SOLN
INTRAMUSCULAR | Status: DC | PRN
  Administered 2015-12-17: 10 mg via INTRAVENOUS

## 2015-12-17 MED ORDER — ONDANSETRON HCL 4 MG/2ML IJ SOLN
INTRAMUSCULAR | Status: AC
  Filled 2015-12-17: qty 2

## 2015-12-17 MED ORDER — METOPROLOL TARTARATE 1 MG/ML SYRINGE (5ML)
Status: DC | PRN
  Administered 2015-12-17 (×4): 1 mg via INTRAVENOUS

## 2015-12-17 MED ORDER — FENTANYL CITRATE (PF) 100 MCG/2ML IJ SOLN
INTRAMUSCULAR | Status: AC
  Filled 2015-12-17: qty 4

## 2015-12-17 MED ORDER — HYDROMORPHONE HCL 1 MG/ML IJ SOLN
INTRAMUSCULAR | Status: AC
  Filled 2015-12-17: qty 1

## 2015-12-17 MED ORDER — ROCURONIUM BROMIDE 10 MG/ML (PF) SYRINGE
PREFILLED_SYRINGE | INTRAVENOUS | Status: AC
  Filled 2015-12-17: qty 10

## 2015-12-17 MED ORDER — FENTANYL CITRATE (PF) 100 MCG/2ML IJ SOLN
INTRAMUSCULAR | Status: DC | PRN
  Administered 2015-12-17 (×2): 50 ug via INTRAVENOUS
  Administered 2015-12-17: 200 ug via INTRAVENOUS

## 2015-12-17 SURGICAL SUPPLY — 71 items
ADH SKN CLS APL DERMABOND .7 (GAUZE/BANDAGES/DRESSINGS) ×1
BUR EGG ELITE 4.0 (BURR) IMPLANT
BUR EGG ELITE 4.0MM (BURR)
BUR MATCHSTICK NEURO 3.0 LAGG (BURR) ×3 IMPLANT
CANISTER SUCTION 2500CC (MISCELLANEOUS) ×3 IMPLANT
CLOSURE STERI-STRIP 1/2X4 (GAUZE/BANDAGES/DRESSINGS) ×1
CLOSURE WOUND 1/2 X4 (GAUZE/BANDAGES/DRESSINGS) ×1
CLSR STERI-STRIP ANTIMIC 1/2X4 (GAUZE/BANDAGES/DRESSINGS) ×1 IMPLANT
CORDS BIPOLAR (ELECTRODE) ×3 IMPLANT
COVER SURGICAL LIGHT HANDLE (MISCELLANEOUS) ×3 IMPLANT
DERMABOND ADVANCED (GAUZE/BANDAGES/DRESSINGS) ×2
DERMABOND ADVANCED .7 DNX12 (GAUZE/BANDAGES/DRESSINGS) ×1 IMPLANT
DRAIN CHANNEL 15F RND FF W/TCR (WOUND CARE) ×3 IMPLANT
DRAPE POUCH INSTRU U-SHP 10X18 (DRAPES) ×3 IMPLANT
DRAPE SURG 17X23 STRL (DRAPES) ×3 IMPLANT
DRAPE U-SHAPE 47X51 STRL (DRAPES) ×3 IMPLANT
DRSG AQUACEL AG ADV 3.5X10 (GAUZE/BANDAGES/DRESSINGS) ×3 IMPLANT
DURAPREP 26ML APPLICATOR (WOUND CARE) ×3 IMPLANT
ELECT BLADE 4.0 EZ CLEAN MEGAD (MISCELLANEOUS)
ELECT CAUTERY BLADE 6.4 (BLADE) ×3 IMPLANT
ELECT PENCIL ROCKER SW 15FT (MISCELLANEOUS) ×3 IMPLANT
ELECT REM PT RETURN 9FT ADLT (ELECTROSURGICAL) ×3
ELECTRODE BLDE 4.0 EZ CLN MEGD (MISCELLANEOUS) IMPLANT
ELECTRODE REM PT RTRN 9FT ADLT (ELECTROSURGICAL) ×1 IMPLANT
EVACUATOR 1/8 PVC DRAIN (DRAIN) IMPLANT
EVACUATOR SILICONE 100CC (DRAIN) ×3 IMPLANT
GLOVE BIO SURGEON STRL SZ 6.5 (GLOVE) ×2 IMPLANT
GLOVE BIO SURGEONS STRL SZ 6.5 (GLOVE) ×1
GLOVE BIOGEL PI IND STRL 6.5 (GLOVE) ×1 IMPLANT
GLOVE BIOGEL PI IND STRL 8.5 (GLOVE) ×1 IMPLANT
GLOVE BIOGEL PI INDICATOR 6.5 (GLOVE) ×2
GLOVE BIOGEL PI INDICATOR 8.5 (GLOVE) ×2
GLOVE SS BIOGEL STRL SZ 8.5 (GLOVE) ×1 IMPLANT
GLOVE SUPERSENSE BIOGEL SZ 8.5 (GLOVE) ×2
GOWN STRL REUS W/ TWL LRG LVL3 (GOWN DISPOSABLE) ×1 IMPLANT
GOWN STRL REUS W/TWL 2XL LVL3 (GOWN DISPOSABLE) ×6 IMPLANT
GOWN STRL REUS W/TWL LRG LVL3 (GOWN DISPOSABLE) ×3
KIT BASIN OR (CUSTOM PROCEDURE TRAY) ×3 IMPLANT
KIT ROOM TURNOVER OR (KITS) ×3 IMPLANT
NDL SPNL 18GX3.5 QUINCKE PK (NEEDLE) ×2 IMPLANT
NEEDLE 22X1 1/2 (OR ONLY) (NEEDLE) ×3 IMPLANT
NEEDLE SPNL 18GX3.5 QUINCKE PK (NEEDLE) ×6 IMPLANT
NS IRRIG 1000ML POUR BTL (IV SOLUTION) ×5 IMPLANT
PACK LAMINECTOMY ORTHO (CUSTOM PROCEDURE TRAY) ×3 IMPLANT
PACK UNIVERSAL I (CUSTOM PROCEDURE TRAY) ×3 IMPLANT
PAD ARMBOARD 7.5X6 YLW CONV (MISCELLANEOUS) ×6 IMPLANT
PATTIES SURGICAL .5 X.5 (GAUZE/BANDAGES/DRESSINGS) ×2 IMPLANT
PATTIES SURGICAL .5 X1 (DISPOSABLE) ×4 IMPLANT
SPONGE SURGIFOAM ABS GEL 100 (HEMOSTASIS) IMPLANT
STRIP CLOSURE SKIN 1/2X4 (GAUZE/BANDAGES/DRESSINGS) ×2 IMPLANT
SURGIFLO W/THROMBIN 8M KIT (HEMOSTASIS) ×2 IMPLANT
SUT BONE WAX W31G (SUTURE) ×3 IMPLANT
SUT MON AB 3-0 SH 27 (SUTURE) ×3
SUT MON AB 3-0 SH27 (SUTURE) ×1 IMPLANT
SUT PDS AB 1 CT  36 (SUTURE) ×4
SUT PDS AB 1 CT 36 (SUTURE) IMPLANT
SUT VIC AB 0 CT1 18XCR BRD 8 (SUTURE) IMPLANT
SUT VIC AB 0 CT1 27 (SUTURE) ×3
SUT VIC AB 0 CT1 27XBRD ANBCTR (SUTURE) ×1 IMPLANT
SUT VIC AB 0 CT1 8-18 (SUTURE) ×6
SUT VIC AB 1 CT1 18XCR BRD 8 (SUTURE) IMPLANT
SUT VIC AB 1 CT1 8-18 (SUTURE) ×3
SUT VIC AB 1 CTX 36 (SUTURE) ×6
SUT VIC AB 1 CTX36XBRD ANBCTR (SUTURE) ×2 IMPLANT
SUT VIC AB 2-0 CT1 18 (SUTURE) ×3 IMPLANT
SYR BULB IRRIGATION 50ML (SYRINGE) ×3 IMPLANT
SYR CONTROL 10ML LL (SYRINGE) ×3 IMPLANT
TOWEL OR 17X24 6PK STRL BLUE (TOWEL DISPOSABLE) ×3 IMPLANT
TOWEL OR 17X26 10 PK STRL BLUE (TOWEL DISPOSABLE) ×3 IMPLANT
WATER STERILE IRR 1000ML POUR (IV SOLUTION) ×1 IMPLANT
YANKAUER SUCT BULB TIP NO VENT (SUCTIONS) ×3 IMPLANT

## 2015-12-17 NOTE — Anesthesia Procedure Notes (Signed)
Procedure Name: Intubation Date/Time: 12/17/2015 12:56 PM Performed by: Roderic Palau Pre-anesthesia Checklist: Patient identified, Emergency Drugs available, Suction available and Patient being monitored Patient Re-evaluated:Patient Re-evaluated prior to inductionOxygen Delivery Method: Circle system utilized Preoxygenation: Pre-oxygenation with 100% oxygen Intubation Type: IV induction Ventilation: Oral airway inserted - appropriate to patient size Laryngoscope Size: Mac and 4 Grade View: Grade II Tube type: Oral Tube size: 7.5 mm Number of attempts: 1 Airway Equipment and Method: Stylet Placement Confirmation: ETT inserted through vocal cords under direct vision,  positive ETCO2 and breath sounds checked- equal and bilateral Secured at: 23 cm Tube secured with: Tape Dental Injury: Teeth and Oropharynx as per pre-operative assessment

## 2015-12-17 NOTE — OR Nursing (Signed)
Dr. Maree Erie called stating film 2 localizes L5--S1

## 2015-12-17 NOTE — Anesthesia Postprocedure Evaluation (Signed)
Anesthesia Post Note  Patient: Bobby Porter  Procedure(s) Performed: Procedure(s) (LRB): HEMI-LAMINECTOMY DISCECTOMY L5 - S1 BILATERAL  LEVEL 1 (Bilateral)  Patient location during evaluation: PACU Anesthesia Type: General Level of consciousness: awake Pain management: pain level controlled Vital Signs Assessment: post-procedure vital signs reviewed and stable Respiratory status: spontaneous breathing Cardiovascular status: stable Anesthetic complications: no    Last Vitals:  Vitals:   12/17/15 1658 12/17/15 1712  BP: 139/86 136/74  Pulse: 82 75  Resp: 12 10  Temp:  36.6 C    Last Pain:  Vitals:   12/17/15 1712  TempSrc:   PainSc: 3                  EDWARDS,Tyrus Wilms

## 2015-12-17 NOTE — Anesthesia Preprocedure Evaluation (Signed)
Anesthesia Evaluation  Patient identified by MRN, date of birth, ID band Patient awake    Reviewed: Allergy & Precautions, H&P , NPO status , Patient's Chart, lab work & pertinent test results  Airway Mallampati: II  TM Distance: >3 FB Neck ROM: Full    Dental no notable dental hx. (+) Teeth Intact, Dental Advisory Given   Pulmonary Current Smoker,    Pulmonary exam normal breath sounds clear to auscultation       Cardiovascular hypertension, Pt. on medications  Rhythm:Regular Rate:Normal     Neuro/Psych Anxiety Depression negative neurological ROS     GI/Hepatic Neg liver ROS, GERD  Controlled,  Endo/Other  negative endocrine ROSMorbid obesity  Renal/GU negative Renal ROS  negative genitourinary   Musculoskeletal   Abdominal   Peds  Hematology negative hematology ROS (+)   Anesthesia Other Findings   Reproductive/Obstetrics negative OB ROS                             Anesthesia Physical Anesthesia Plan  ASA: III  Anesthesia Plan: General   Post-op Pain Management:    Induction: Intravenous  Airway Management Planned: Oral ETT  Additional Equipment:   Intra-op Plan:   Post-operative Plan: Extubation in OR  Informed Consent: I have reviewed the patients History and Physical, chart, labs and discussed the procedure including the risks, benefits and alternatives for the proposed anesthesia with the patient or authorized representative who has indicated his/her understanding and acceptance.   Dental advisory given  Plan Discussed with: CRNA  Anesthesia Plan Comments:         Anesthesia Quick Evaluation

## 2015-12-17 NOTE — H&P (Signed)
History of Present Illness  The patient is a 41 year old male who comes in today for a preoperative History and Physical. The patient is scheduled for a He states that he had an appt this past Saturday with his PCP for med clearance "they called and cancelled the appt" (He does not have med clearance) Bilateral Hemilaminectomy adn Discectomy L5-S1 to be performed by Dr. Duane Lope D. Rolena Infante, MD at [facility] on 12/18/2015 . Please see the hospital record for complete dictated history and physical. Pt does reports smoking. Strongly advised the pt to stop.  Additional reasons for visit:  Transition into care is described as the following: The patient is transitioning into care and a summary of care was reviewed.   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 First Degree Relatives  reported Heart disease in male family member before age 51  Hypertension  Father, Mother, Sister.  Social History  Tobacco use  Current every day smoker. 11/12/2015: smoke(d) less than 1/2 pack(s) per day Children  5 or more Current drinker  11/12/2015: Currently drinks beer and wine only occasionally per week Current work status  working full time Exercise  Exercises weekly; does running / walking and gym / [employer] Living situation  live with spouse Marital status  married No history of drug/alcohol rehab  Not under pain contract  Number of flights of stairs before winded  4-5  Medication History Voltaren (75MG  Tablet DR, Oral) Active. (bid Rx'd by PCP) Norvasc (5MG  Tablet, Oral) Active. (??mg bid) Cabergoline (0.5MG  Tablet, Oral) Active. (2 x week) Oxycodone-Acetaminophen (5-325MG  Tablet, Oral) Active. Medications Reconciled  Vitals  12/14/2015 1:26 PM Weight: 321 lb Height:  74in Body Surface Area: 2.66 m Body Mass Index: 41.21 kg/m  Temp.: 98.17F  Pulse: 76 (Regular)  BP: 173/122 (Sitting, Right Arm, Standard)  General General Appearance-Not in acute distress. Orientation-Oriented X3. Build & Nutrition-Well nourished and Well developed.  Integumentary General Characteristics Surgical Scars - no surgical scar evidence of previous lumbar surgery. Lumbar Spine-Skin examination of the lumbar spine is without deformity, skin lesions, lacerations or abrasions.  Chest and Lung Exam Auscultation Breath sounds - Normal and Clear.  Cardiovascular Auscultation Rhythm - Regular rate and rhythm.  Abdomen Palpation/Percussion Palpation and Percussion of the abdomen reveal - Soft, Non Tender and No Rebound tenderness.  Peripheral Vascular Lower Extremity Palpation - Posterior tibial pulse - Bilateral - 2+. Dorsalis pedis pulse - Bilateral - 2+.  Neurologic Sensation Lower Extremity - Left - sensation is diminished in the lower extremity. Right - sensation is intact in the lower extremity. Reflexes Patellar Reflex - Bilateral - 2+. Achilles Reflex - Bilateral - 2+. Clonus - Bilateral - clonus not present. Hoffman's Sign - Bilateral - Hoffman's sign not present. Testing Seated Straight Leg Raise - Left - Seated straight leg raise positive.  Musculoskeletal Spine/Ribs/Pelvis  Lumbosacral Spine: Inspection and Palpation - Tenderness - left lumbar paraspinals tender to palpation and right lumbar paraspinals tender to palpation. Strength and Tone: Strength - Hip Flexion - Bilateral - 5/5. Knee Extension - Bilateral - 5/5. Knee Flexion - Bilateral - 5/5. Ankle Dorsiflexion - Left - 4-/5. Right - 5/5. Ankle Plantarflexion - Bilateral - 5/5. Heel walk - Bilateral - able to heel walk without difficulty. Toe Walk - Bilateral - able to walk on toes without difficulty. Heel-Toe  Walk - Bilateral - able to heel-toe walk without difficulty. ROM - Flexion  - moderately decreased range of motion and painful. Extension - moderately decreased range of motion and painful. Left Lateral Bending - moderately decreased range of motion and painful. Right Lateral Bending - moderately decreased range of motion and painful. Right Rotation - moderately decreased range of motion and painful. Left Rotation - moderately decreased range of motion and painful. Pain - . Lumbosacral Spine - Waddell's Signs - no Waddell's signs present. Lower Extremity Range of Motion - No true hip, knee or ankle pain with range of motion. Gait and Station - [employer] - no assistive devices.  RADIOGRAPHS His MRI from 11/23/2015 shows a large left L5-S1 disk herniation with compression of the traversing S1 nerve root and he also had moderate to significant right foraminal and lateral recess stenosis with disk protrusion there as well. Minimal changes at L4-L5 without stenosis or neural compression.  Goal Of Surgery: Discussed that goal of surgery is to reduce pain and improve function and quality of life. Patient is aware that despite all appropriate treatment that there pain and function could be the same, worse, or different.  Posterior Lumbar Decompression/disectomy: Risks of surgery include infection, bleeding, nerve damage, death, stroke, paralysis, failure to heal, need for further surgery, ongoing or worse pain, need for further surgery, CSF leak, loss of bowel or bladder, and recurrent disc herniation or Stenosis which would necessitate need for further surgery.  Patient with large lumbar HNP with radiculopathy. Plan on disectomy and decompression.

## 2015-12-17 NOTE — Brief Op Note (Signed)
12/17/2015  4:26 PM  PATIENT:  Sherlynn Carbon  40 y.o. male  PRE-OPERATIVE DIAGNOSIS:  large L5 - S1 HNP  POST-OPERATIVE DIAGNOSIS:  large L5 - S1 HNP  PROCEDURE:  Procedure(s): HEMI-LAMINECTOMY DISCECTOMY L5 - S1 BILATERAL  LEVEL 1 (Bilateral)  SURGEON:  Surgeon(s) and Role:    * Melina Schools, MD - Primary  PHYSICIAN ASSISTANT:   ASSISTANTS: none   ANESTHESIA:   general  EBL:  Total I/O In: 1000 [I.V.:1000] Out: 200 [Blood:200]  BLOOD ADMINISTERED:none  DRAINS: 1 hemovac   LOCAL MEDICATIONS USED:  LIDOCAINE   SPECIMEN:  No Specimen  DISPOSITION OF SPECIMEN:  N/A  COUNTS:  YES  TOURNIQUET:  * No tourniquets in log *  DICTATION: .Other Dictation: Dictation Number [id number]  PLAN OF CARE: Admit for overnight observation  PATIENT DISPOSITION:  PACU - hemodynamically stable.

## 2015-12-17 NOTE — Transfer of Care (Signed)
Immediate Anesthesia Transfer of Care Note  Patient: Bobby Porter  Procedure(s) Performed: Procedure(s): HEMI-LAMINECTOMY DISCECTOMY L5 - S1 BILATERAL  LEVEL 1 (Bilateral)  Patient Location: PACU  Anesthesia Type:General  Level of Consciousness: awake, oriented, sedated, patient cooperative and responds to stimulation  Airway & Oxygen Therapy: Patient Spontanous Breathing and Patient connected to nasal cannula oxygen  Post-op Assessment: Report given to RN, Post -op Vital signs reviewed and stable, Patient moving all extremities and Patient moving all extremities X 4  Post vital signs: Reviewed and stable  Last Vitals:  Vitals:   12/17/15 1349 12/17/15 1627  BP: (!) 127/56 (!) 160/107  Pulse: (!) 55 92  Resp: 18 10  Temp: 36.5 C 36.1 C    Last Pain:  Vitals:   12/17/15 1349  TempSrc: Oral      Patients Stated Pain Goal: 4 ([id number])  Complications: No apparent anesthesia complications

## 2015-12-18 ENCOUNTER — Encounter (HOSPITAL_COMMUNITY): Payer: Self-pay | Admitting: Orthopedic Surgery

## 2015-12-18 DIAGNOSIS — M5117 Intervertebral disc disorders with radiculopathy, lumbosacral region: Secondary | ICD-10-CM | POA: Diagnosis not present

## 2015-12-18 NOTE — Op Note (Signed)
NAMEDALI, SIMONIAN             ACCOUNT NO.:  [id number]  MEDICAL RECORD NO.:  [id number]  LOCATION:  3C02C                        FACILITY:  [facility]  PHYSICIAN:  Batoul Limes D. Rolena Infante, M.D. DATE OF BIRTH:  1975-09-30  DATE OF PROCEDURE:  12/17/2015 DATE OF DISCHARGE:                              OPERATIVE REPORT   PREOPERATIVE DIAGNOSIS:  Lumbar disk herniation, L5-S1 with stenosis.  POSTOPERATIVE DIAGNOSIS:  Lumbar disk herniation, L5-S1 with stenosis.  OPERATIVE PROCEDURE:  L5 laminectomy, left side; laminotomy, right side with decompression, L4 through S1.  INTRAOPERATIVE FINDINGS:  Very adherent hard disk osteophyte complex to the ventral surface of the thecal sac, which could not be separated from the dura without risking neural injury and CSF leak.  HISTORY:  This is a very pleasant, morbidly obese, 40 year old gentleman, who presents complaining of severe back, buttock, and neuropathic leg pain, left side worse than right.  Imaging studies demonstrated a hard disk osteophyte complex at L5-S1.  There was cranial migration.  After discussing treatment options and failing to improve with conservative management, we elected to proceed with surgery. Initially, I thought bilateral hemi laminotomies of L5 would be required to adequately decompress this.  OPERATIVE NOTE:  The patient was brought to the operating room and placed supine on the operating table.  After successful induction of general anesthesia and endotracheal intubation, TEDs and SCDs were applied.  He was turned prone onto the Wilson frame, and all bony prominences were well padded.  The back was prepped and draped in a standard fashion.  Two needles were placed, and an x-ray was taken to localize the skin incision.  A generous midline incision was made, and sharp dissection was carried out down through the significant adipose tissue to the deep fascia.  The deep fascia was sharply incised, and I exposed the  spinous process of L4-5 and S1 and a portion of S2.  I then stripped the paraspinal muscles to expose the entire posterior elements of L4-L5 and S1 and a portion of S2.  Because of his morbid obesity, I needed a wide exposure in order to adequately decompress it.  At this point, I took an x-ray with a Penfield 4 underneath the L5 lamina and confirmed my level.  Once this was done, I used a double- action Leksell rongeur to remove a portion of the left L5 lamina.  I then used my 2 and 3 mm Kerrison punch to perform a generous laminotomy of L5 on this left side.  I then released the ligamentum flavum with a Penfield 4 and then resected it with a 2 mm Kerrison punch.  I then worked into the lateral gutter.  At this point, I could visualize the traversing S1 nerve root and the disk.  I began working superiorly.  An annulotomy was performed, and I removed some fragments of disk material. Based on his preoperative MRI, I needed a larger fragment that migrated cephalad and so I continued performing extending my laminotomy.  At this point, I then was able to visualize a portion of the hard disk, which was very adherent to the thecal sac.  I could visualize a portion of it on the ventral surface.  At  this point, I elected to perform my right- sided laminotomy in order to widen my decompression.  The laminotomy was performed.  I worked my way into the lateral recess using my Kerrison rongeurs.  I could easily palpate the S1 pedicle, and I adequately decompressed the lateral recess.  I then completed at this point in order to truly know that I was adequately decompressed on this left side, I completed my laminotomy on the left.  Once I converted this to a complete laminectomy, I trimmed that.  I made sure I was also decompressed centrally.  At this point, I could now visualize from the L4-5 disk down to the L5-S1 disk.  I could also visualize the L5 and S1 nerve roots.  It was at this point I could  definitely see the undersurface and feel the hard disk osteophyte.  I attempted using a Penfield 4 to dissect it free of the thecal sac, but I was unable to. At this point, with the central decompression and left hemilaminectomy completed, I had a complete decompression on the most symptomatic side. I felt as though even though I could not get the hard disk osteophyte out, I had adequately decompressed the space, so there was no further neural compression.  I irrigated the wound copiously with normal saline and then obtained hemostasis using bipolar electrocautery.  I placed a deep drain and then closed the deep fascia with interrupted #1 Vicryl sutures.  I then closed with a 2nd layer of 0 Vicryl sutures.  Because of his size and my concern over wound healing, I then closed the skin with a running vertical mattress suture.  Dry dressings were applied, and the patient was extubated, transferred to the PACU without any incident.  At the end of the case, all dressing and sponge counts were correct.  There were no adverse intraoperative events.     Newt Levingston D. Rolena Infante, M.D.     DDB/MEDQ  D:  12/17/2015  T:  12/18/2015  Job:  [id number]  cc:   Melina Schools, MD's Office

## 2015-12-18 NOTE — Evaluation (Signed)
Occupational Therapy Evaluation and Discharge Patient Details Name: Bobby Porter MRN: [id number] DOB: 1975/07/09 Today's Date: 12/18/2015    History of Present Illness Pt is a 40 y.o. male s/p HEMI-LAMINECTOMY DISCECTOMY L5 - S1. No pertinent PMHx in chart.    Clinical Impression   Pt reports he was independent with ADL PTA. Currently pt overall supervision with ADL and functional mobility with the exception of min assist for LB ADL. All back, safety, and ADL education completed with pt and wife; they have no further questions or concerns for OT at this time. Pt planning to d/c home with intermittent family supervision. No further acute OT needs identified; signing off at this time. Please re-consult if needs change. Thank you for this referral.    Follow Up Recommendations  No OT follow up;Supervision - Intermittent    Equipment Recommendations  None recommended by OT    Recommendations for Other Services PT consult     Precautions / Restrictions Precautions Precautions: Back;Fall Precaution Booklet Issued: Yes (comment) Precaution Comments: Educated pt and wife in 3/3 back precautions. Required Braces or Orthoses: Spinal Brace Spinal Brace: Lumbar corset;Applied in sitting position Restrictions Weight Bearing Restrictions: No      Mobility Bed Mobility Overal bed mobility: Needs Assistance Bed Mobility: Rolling;Sidelying to Sit Rolling: Supervision Sidelying to sit: Supervision       General bed mobility comments: HOB flat with use of bed rail. VCs for technique.  Transfers Overall transfer level: Needs assistance Equipment used: None Transfers: Sit to/from Stand Sit to Stand: Supervision         General transfer comment: Supervision for safety. No unsteadiness or LOB.    Balance Overall balance assessment: Needs assistance Sitting-balance support: Feet supported;No upper extremity supported Sitting balance-Leahy Scale: Good     Standing balance  support: No upper extremity supported;During functional activity Standing balance-Leahy Scale: Good                              ADL Overall ADL's : Needs assistance/impaired Eating/Feeding: Independent;Sitting   Grooming: Supervision/safety;Standing Grooming Details (indicate cue type and reason): Educated pt on use of 2 cups for oral care Upper Body Bathing: Set up;Supervision/ safety;Sitting   Lower Body Bathing: Minimal assistance;Sit to/from stand   Upper Body Dressing : Set up;Supervision/safety;Sitting Upper Body Dressing Details (indicate cue type and reason): to don back brace Lower Body Dressing: Minimal assistance;Sit to/from stand Lower Body Dressing Details (indicate cue type and reason): Pt with difficulty crossing foot over opposite knee. Wife to assist as needed. Educated on compensatory strategies for LB ADL. Toilet Transfer: Supervision/safety;Ambulation;Regular Toilet;Grab bars Toilet Transfer Details (indicate cue type and reason): Use of grab bar on R side to simulate home environment   Toileting - Clothing Manipulation Details (indicate cue type and reason): Educated on proper technique for no twisting during peri care and use of wet wipes. Tub/ Shower Transfer: Tub transfer;Supervision/safety;Ambulation Tub/Shower Transfer Details (indicate cue type and reason): Simulated tub transfer and discussed supervision for safety upon return home. Functional mobility during ADLs: Supervision/safety General ADL Comments: Educated pt on maintaining back precautions during functional activities, keeping frequently used items at counter top height, log roll technique for bed mobility, frequent mobility throughout the day upon return home.     Vision Vision Assessment?: No apparent visual deficits   Perception     Praxis      Pertinent Vitals/Pain Pain Assessment: 0-10 Pain Score: 5  Pain  Location: back Pain Descriptors / Indicators: Aching;Sore Pain  Intervention(s): Monitored during session;Repositioned     Hand Dominance     Extremity/Trunk Assessment Upper Extremity Assessment Upper Extremity Assessment: Overall WFL for tasks assessed   Lower Extremity Assessment Lower Extremity Assessment: Defer to PT evaluation   Cervical / Trunk Assessment Cervical / Trunk Assessment: Other exceptions Cervical / Trunk Exceptions: s/p spinal sx, morbid obesity   Communication Communication Communication: No difficulties   Cognition Arousal/Alertness: Awake/alert Behavior During Therapy: WFL for tasks assessed/performed Overall Cognitive Status: Within Functional Limits for tasks assessed                     General Comments       Exercises       Shoulder Instructions      Home Living Family/patient expects to be discharged to:: Private residence Living Arrangements: Spouse/significant other Available Help at Discharge: Family;Available PRN/intermittently (wife works during the day) Type of Home: House Home Access: Stairs to enter [employer] of Steps: 2   Home Layout: Multi-level;Able to live on main level with bedroom/bathroom     Bathroom Shower/Tub: Tub/shower unit Shower/tub characteristics: Architectural technologist: Standard     Home Equipment: None          Prior Functioning/Environment Level of Independence: Independent                 OT Problem List:     OT Treatment/Interventions:      OT Goals(Current goals can be found in the care plan section) Acute Rehab OT Goals Patient Stated Goal: return home OT Goal Formulation: All assessment and education complete, DC therapy  OT Frequency:     Barriers to D/C:            Co-evaluation              End of Session Equipment Utilized During Treatment: Back brace Nurse Communication: Mobility status;Other (comment) (no equipment or f/u needs)  Activity Tolerance: Patient tolerated treatment well Patient left: in  chair;with call bell/phone within reach   Time: 0745-0800 OT Time Calculation (min): 15 min Charges:  OT General Charges $OT Visit: 1 Procedure OT Evaluation $OT Eval Moderate Complexity: 1 Procedure G-Codes: OT G-codes **NOT FOR INPATIENT CLASS** Functional Assessment Tool Used: Clinical judgement Functional Limitation: Self care Self Care Current Status [id number]): At least 1 percent but less than 20 percent impaired, limited or restricted Self Care Goal Status [id number]): At least 1 percent but less than 20 percent impaired, limited or restricted Self Care Discharge Status [phone]): At least 1 percent but less than 20 percent impaired, limited or restricted   Binnie Kand M.S., OTR/L Pager: [phone]  12/18/2015, 8:41 AM

## 2015-12-18 NOTE — Evaluation (Signed)
Physical Therapy Evaluation/ Discharge Patient Details Name: Bobby Porter MRN: [id number] DOB: May 17, 1975 Today's Date: 12/18/2015   History of Present Illness  Pt is a 40 y.o. male s/p HEMI-LAMINECTOMY DISCECTOMY L5 - S1. No pertinent PMHx in chart.   Clinical Impression  Pt very pleasant, moving well and able to doff brace without assist. Pt and wife educated for all precautions, transfers, gait and mobility with pt able to perform at mod I level for all functional activity. No need for further therapy or DME at this time with pt and wife aware and agreeable. Encouraged daily ambulation.     Follow Up Recommendations No PT follow up    Equipment Recommendations  None recommended by PT    Recommendations for Other Services       Precautions / Restrictions Precautions Precautions: Back Precaution Booklet Issued: Yes (comment) Precaution Comments: pt and wife educated for all precautions and able to verbalize Required Braces or Orthoses: Spinal Brace Spinal Brace: Lumbar corset;Applied in sitting position Restrictions Weight Bearing Restrictions: No      Mobility  Bed Mobility Overal bed mobility: Needs Assistance Bed Mobility: Supine to Sit;Sit to Supine Rolling: Supervision Sidelying to sit: Supervision   Sit to supine: Supervision   General bed mobility comments: cues for sequence to prevent twisting as well as positioning with pillows in bed  Transfers Overall transfer level: Needs assistance Equipment used: None Transfers: Sit to/from Stand Sit to Stand: Modified independent (Device/Increase time)         General transfer comment: Supervision for safety. No unsteadiness or LOB.  Ambulation/Gait Ambulation/Gait assistance: Modified independent (Device/Increase time) Ambulation Distance (Feet): 500 Feet Assistive device: None Gait Pattern/deviations: Step-through pattern;Decreased stride length   Gait velocity interpretation: Below normal speed for  age/gender    Stairs Stairs: Yes Stairs assistance: Modified independent (Device/Increase time) Stair Management: Alternating pattern;Forwards;No rails Number of Stairs: 11 General stair comments: pt able to complete stairs without difficulty or LOB  Wheelchair Mobility    Modified Rankin (Stroke Patients Only)       Balance Overall balance assessment: Needs assistance Sitting-balance support: Feet supported;No upper extremity supported Sitting balance-Leahy Scale: Good     Standing balance support: No upper extremity supported;During functional activity Standing balance-Leahy Scale: Good                               Pertinent Vitals/Pain Pain Assessment: 0-10 Pain Score: 6  Pain Location: incision Pain Descriptors / Indicators: Sore Pain Intervention(s): Limited activity within patient's tolerance;Premedicated before session;Repositioned    Home Living Family/patient expects to be discharged to:: Private residence Living Arrangements: Spouse/significant other Available Help at Discharge: Family;Available PRN/intermittently Type of Home: House Home Access: Stairs to enter Entrance Stairs-Rails: None Entrance Stairs-Number of Steps: 2 Home Layout: Multi-level;Able to live on main level with bedroom/bathroom Home Equipment: None      Prior Function Level of Independence: Independent               Hand Dominance        Extremity/Trunk Assessment   Upper Extremity Assessment: Overall WFL for tasks assessed           Lower Extremity Assessment: Overall WFL for tasks assessed      Cervical / Trunk Assessment: Other exceptions  Communication   Communication: No difficulties  Cognition Arousal/Alertness: Awake/alert Behavior During Therapy: WFL for tasks assessed/performed Overall Cognitive Status: Within Functional Limits for tasks assessed  General Comments      Exercises     Assessment/Plan     PT Assessment Patent does not need any further PT services  PT Problem List            PT Treatment Interventions      PT Goals (Current goals can be found in the Care Plan section)  Acute Rehab PT Goals Patient Stated Goal: return home PT Goal Formulation: All assessment and education complete, DC therapy    Frequency     Barriers to discharge        Co-evaluation               End of Session Equipment Utilized During Treatment: Back brace Activity Tolerance: Patient tolerated treatment well Patient left: in bed;with call bell/phone within reach;with family/visitor present Nurse Communication: Precautions    Functional Assessment Tool Used: clinical judgement Functional Limitation: Mobility: Walking and moving around Mobility: Walking and Moving Around Current Status [id number]): At least 1 percent but less than 20 percent impaired, limited or restricted Mobility: Walking and Moving Around Goal Status [phone]): At least 1 percent but less than 20 percent impaired, limited or restricted Mobility: Walking and Moving Around Discharge Status [phone]): At least 1 percent but less than 20 percent impaired, limited or restricted    Time: 0919-0933 PT Time Calculation (min) (ACUTE ONLY): 14 min   Charges:   PT Evaluation $PT Eval Low Complexity: 1 Procedure     PT G Codes:   PT G-Codes **NOT FOR INPATIENT CLASS** Functional Assessment Tool Used: clinical judgement Functional Limitation: Mobility: Walking and moving around Mobility: Walking and Moving Around Current Status [id number]): At least 1 percent but less than 20 percent impaired, limited or restricted Mobility: Walking and Moving Around Goal Status [phone]): At least 1 percent but less than 20 percent impaired, limited or restricted Mobility: Walking and Moving Around Discharge Status [phone]): At least 1 percent but less than 20 percent impaired, limited or restricted    Melford Aase 12/18/2015, 10:39 AM  Elwyn Reach, [place]

## 2015-12-18 NOTE — Progress Notes (Signed)
    Subjective: Procedure(s) (LRB): HEMI-LAMINECTOMY DISCECTOMY L5 - S1 BILATERAL  LEVEL 1 (Bilateral) 1 Day Post-Op  Patient reports pain as 2 on 0-10 scale.  Reports decreased leg pain reports incisional back pain   Positive void Negative bowel movement Positive flatus Negative chest pain or shortness of breath  Objective: Vital signs in last 24 hours: Temp:  [97 F (36.1 C)-98.2 F (36.8 C)] 98.2 F (36.8 C) (11/10 0330) Pulse Rate:  [55-95] 88 (11/10 0330) Resp:  [10-18] 18 (11/10 0330) BP: (117-160)/(56-107) 130/71 (11/10 0330) SpO2:  [96 %-100 %] 96 % (11/10 0330)  Intake/Output from previous day: 11/09 0701 - 11/10 0700 In: 2040 [P.O.:240; I.V.:1800] Out: 305 [Drains:105; Blood:200]  Labs:  Recent Labs  12/17/15 1016  WBC 6.7  RBC 4.44  HCT 38.9*  PLT 363    Recent Labs  12/17/15 1016  NA 138  K 3.6  CL 107  CO2 26  BUN 7  CREATININE 0.83  GLUCOSE 90  CALCIUM 8.9   No results for input(s): LABPT, INR in the last 72 hours.  Physical Exam: Neurologically intact ABD soft Intact pulses distally Incision: dressing C/D/I Compartment soft  Assessment/Plan: Patient stable  xrays n/a Continue mobilization with physical therapy Continue care  Advance diet Up with therapy  D/c drain Plan on d/c to home later today  Melina Schools, MD [place] [phone]

## 2015-12-29 NOTE — Discharge Summary (Signed)
Physician Discharge Summary  Patient ID: Bobby Porter MRN: [id number] DOB/AGE: Jan 18, 1976 40 y.o.  Admit date: 12/17/2015 Discharge date: 12/29/2015  Admission Diagnoses:  Lumbar disk herniation L5-S1  Discharge Diagnoses:  Active Problems:   Spinal stenosis, lumbar   Past Medical History:  Diagnosis Date  . Anxiety   . Depression   . Gallstones   . GERD (gastroesophageal reflux disease)   . History of kidney stones   . Hypertension   . Pituitary tumor     Surgeries: Procedure(s): HEMI-LAMINECTOMY DISCECTOMY L5 - S1 BILATERAL  LEVEL 1 on 12/17/2015   Consultants (if any):   Discharged Condition: Improved  Hospital Course: Bobby Porter is an 40 y.o. male who was admitted 12/17/2015 with a diagnosis of Lumbar disk herniation and went to the operating room on 12/17/2015 and underwent the above named procedures. Post op day one pt reports incisional pain that is controlled on oral medication.  Pt is voiding w/o difficulty.  Pt is ambulating in the hallway.  Pt is cleared by PT for DC.   He was given perioperative antibiotics:  Anti-infectives    Start     Dose/Rate Route Frequency Ordered Stop   12/17/15 2000  ceFAZolin (ANCEF) IVPB 1 g/50 mL premix     1 g 100 mL/hr over 30 Minutes Intravenous Every 8 hours 12/17/15 1746 12/18/15 0359   12/17/15 1130  ceFAZolin (ANCEF) 3 g in dextrose 5 % 50 mL IVPB     3 g 130 mL/hr over 30 Minutes Intravenous To Surgery 12/16/15 1351 12/17/15 1310    .  He was given sequential compression devices, early ambulation, and TED for DVT prophylaxis.  He benefited maximally from the hospital stay and there were no complications.    Recent vital signs:  Vitals:   12/18/15 0330 12/18/15 0756  BP: 130/71 (!) 138/59  Pulse: 88 83  Resp: 18 18  Temp: 98.2 F (36.8 C) 98.3 F (36.8 C)    Recent laboratory studies:  Lab Results  Component Value Date   HGB 12.7 (L) 12/17/2015   HGB 12.2 (L) 09/17/2015   HGB 11.9 (L) 05/04/2013    Lab Results  Component Value Date   WBC 6.7 12/17/2015   PLT 363 12/17/2015   No results found for: INR Lab Results  Component Value Date   NA 138 12/17/2015   K 3.6 12/17/2015   CL 107 12/17/2015   CO2 26 12/17/2015   BUN 7 12/17/2015   CREATININE 0.83 12/17/2015   GLUCOSE 90 12/17/2015    Discharge Medications:     Medication List    STOP taking these medications   pantoprazole 40 MG tablet Commonly known as:  PROTONIX   PREGNYL 60454 units injection Generic drug:  human chorionic gonadotropin     TAKE these medications   amLODipine 5 MG tablet Commonly known as:  NORVASC Take 5 mg by mouth daily.   cabergoline 0.5 MG tablet Commonly known as:  DOSTINEX Take 0.25 mg by mouth 2 (two) times a week. Takes on Tuesday and Saturday.   methocarbamol 500 MG tablet Commonly known as:  ROBAXIN Take 1 tablet (500 mg total) by mouth 3 (three) times daily as needed for muscle spasms.   ondansetron 4 MG tablet Commonly known as:  ZOFRAN Take 1 tablet (4 mg total) by mouth every 8 (eight) hours as needed for nausea or vomiting.   oxyCODONE-acetaminophen 10-325 MG tablet Commonly known as:  PERCOCET Take 1 tablet by mouth every 4 (  four) hours as needed for pain.       Diagnostic Studies: Dg Lumbar Spine 2-3 Views  Result Date: 12/17/2015 CLINICAL DATA:  Lumbar spine surgery. EXAM: LUMBAR SPINE - 2-3 VIEW COMPARISON:  05/26/2008. FINDINGS: Lumbar vertebra numbered with the lowest lumbar shaped segmented vertebra on lateral view as L5. Metallic markers noted posteriorly at the L3-L4 and L5-S1 disc space levels. IMPRESSION: Metallic markers noted posteriorly at the L3-L4 and L4-L5 disc space levels. Electronically Signed   By: Marcello Moores  Register   On: 12/17/2015 15:35   Dg Spine Portable 1 View  Result Date: 12/17/2015 CLINICAL DATA:  Hemilaminectomy L5-S1 EXAM: PORTABLE SPINE - 1 VIEW COMPARISON:  None. FINDINGS: Assuming there are 5 lumbar type vertebral bodies, tissue  spreaders are in place posteriorly at L5-S1 with a probe directed at the L5-S1 disc space. IMPRESSION: L5-S1 localized, assuming there are 5 lumbar type vertebral bodies. Electronically Signed   By: Nelson Chimes M.D.   On: 12/17/2015 13:51    Disposition: 01-Home or Self Care Post op medication is provided Pt will report to clinic in 2 weeks   Follow-up Information    BROOKS,DAHARI D, MD Follow up in 2 week(s).   Specialty:  Orthopedic Surgery Why:  If symptoms worsen, For suture removal, For wound re-check Contact information: [address] [place] [address] [phone]            Signed: Valinda Hoar 12/29/2015, 11:51 AM

## 2016-02-09 ENCOUNTER — Encounter (HOSPITAL_COMMUNITY): Payer: Self-pay | Admitting: Physical Therapy

## 2016-02-09 ENCOUNTER — Ambulatory Visit (HOSPITAL_COMMUNITY): Payer: BLUE CROSS/BLUE SHIELD | Attending: Orthopedic Surgery | Admitting: Physical Therapy

## 2016-02-09 DIAGNOSIS — M545 Low back pain: Secondary | ICD-10-CM | POA: Diagnosis not present

## 2016-02-09 DIAGNOSIS — R293 Abnormal posture: Secondary | ICD-10-CM | POA: Diagnosis present

## 2016-02-09 DIAGNOSIS — M6281 Muscle weakness (generalized): Secondary | ICD-10-CM | POA: Insufficient documentation

## 2016-02-09 NOTE — Therapy (Signed)
[place], [place], [address] Phone: [phone]   Fax:  [phone]  Physical Therapy Evaluation  Patient Details  Name: Bobby Porter MRN: [id number] Date of Birth: 1976/01/22 Referring Provider: Melina Schools, MD  Encounter Date: 02/09/2016      PT End of Session - 02/09/16 1657    Visit Number 1   Number of Visits 4   Date for PT Re-Evaluation 03/08/16   Authorization Type BCBS   Authorization Time Period 02/09/16 to 03/08/16   PT Start Time 1610  pt arrived late   PT Stop Time 1646   PT Time Calculation (min) 36 min   Activity Tolerance No increased pain;Patient tolerated treatment well   Behavior During Therapy [facility] for tasks assessed/performed      Past Medical History:  Diagnosis Date  . Anxiety   . Depression   . Gallstones   . GERD (gastroesophageal reflux disease)   . History of kidney stones   . Hypertension   . Pituitary tumor     Past Surgical History:  Procedure Laterality Date  . HEMI-MICRODISCECTOMY LUMBAR LAMINECTOMY LEVEL 1 Bilateral 12/17/2015   Procedure: HEMI-LAMINECTOMY DISCECTOMY L5 - S1 BILATERAL  LEVEL 1;  Surgeon: Melina Schools, MD;  Location: [place];  Service: Orthopedics;  Laterality: Bilateral;  . INCISION AND DRAINAGE PERIRECTAL ABSCESS N/A 05/03/2013   Procedure: IRRIGATION AND DEBRIDEMENT PERIRECTAL ABSCESS;  Surgeon: Jamesetta So, MD;  Location: [facility];  Service: General;  Laterality: N/A;    There were no vitals filed for this visit.       Subjective Assessment - 02/09/16 1612    Subjective Pt reports low back pain and numbness into both legs for about 6 months. He underwent hemilaminectomy and discectomy (L5/S1) on 12/17/15. Since the surgery he feels that he is doing pretty good. He will notice pain and numbness along his upper thigh when doing things that are too strenuous. Other than that, things are going well.    Pertinent History Anxiety, depression, HTN,  GERD   Limitations Lifting;Other (comment)  bending    How long can you sit comfortably? unlimited   How long can you stand comfortably? 20-30 min    How long can you walk comfortably? not sure, but able to perform daily walking around the house, etc.    Diagnostic tests none    Patient Stated Goals get ready for return to work   Currently in Pain? No/denies            [facility] PT Assessment - 02/09/16 0001      Assessment   Medical Diagnosis Lx hemilaminectomy, discectomy   Referring Provider Melina Schools, MD   Onset Date/Surgical Date 12/17/15   Next MD Visit 02/22/16   Prior Therapy none      Precautions   Precautions Back   Precaution Comments pt reports after surgery precautions of no bending/lifting/twisting however there was no mention of these at his last appointment.      Balance Screen   Has the patient fallen in the past 6 months No   Has the patient had a decrease in activity level because of a fear of falling?  No   Is the patient reluctant to leave their home because of a fear of falling?  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, some lifting and walking     Cognition   Overall Cognitive Status Within Functional Limits for tasks assessed     Sensation   Light Touch Appears Intact     Posture/Postural Control   Posture Comments Sitting: decreased lumbar lordosis, rounded shoulders      ROM / Strength   AROM / PROM / Strength AROM;Strength     AROM   AROM Assessment Site --     Strength   Strength Assessment Site Hip;Knee;Ankle   Right/Left Hip Right;Left   Right Hip Flexion 5/5   Right Hip Extension 5/5   Right Hip ABduction 5/5   Left Hip Flexion 4-/5   Left Hip Extension 4+/5   Left Hip ABduction 5/5   Right/Left Knee Right;Left   Right Knee Flexion 5/5   Right Knee Extension 5/5    Left Knee Flexion 5/5   Left Knee Extension 5/5   Right/Left Ankle Right;Left   Right Ankle Dorsiflexion 5/5   Left Ankle Dorsiflexion 5/5     Palpation   Palpation comment Surgical incision intact, minimal adhesions noted; Tenderness along superior aspect of surgical incision.      Bed Mobility   Bed Mobility Supine to Sit   Supine to Sit 6: Modified independent (Device/Increase time)  not performing log roll      Transfers   Five time sit to stand comments  7.8 sec, no UE      Log Roll: Inconsistent, requiring verbal cues to perform throughout session.    Lifting mechanics: Able to lift small box from the floor with proper use of LE and without excessive trunk flexion, no cues from therapist.            [facility] Adult PT Treatment/Exercise - 02/09/16 0001      Exercises   Exercises Lumbar     Lumbar Exercises: Supine   Bridge 10 reps   Bridge Limitations x1 set with BLE, x1 set with RLE offset to encourage more weight on Lt.    Straight Leg Raise 10 reps   Straight Leg Raises Limitations alt bent knee raise to straight leg                 PT Education - 02/09/16 1651    Education provided Yes   Education Details importance of following MD restrictions with lifting, etc.; noted areas of limitation; importance of beginning daily walking program, sitting posture awareness and use of lumbar roll for added support; proper desk ergonomics and lifting mechanics for preparation to return to work; recommended PT frequency/POC   Person(s) Educated Patient   Methods Explanation;Demonstration;Handout   Comprehension Verbalized understanding;Returned demonstration;Need further instruction  further instruction needed with log roll           PT Short Term Goals - 02/09/16 1658      PT SHORT TERM GOAL #1   Title Pt will demo consistency and independence with his HEP to improve overall wellness and strength.    Time 1   Period Weeks   Status New           PT  Long Term Goals - 02/09/16 1659      PT LONG TERM GOAL #1   Title Pt will demo proper performance of log roll technique during supine to sit transitions during his sessions without cuing from the therapist, to decrease strain on his back throughout the day.    Time 4   Period  Weeks   Status New     PT LONG TERM GOAL #2   Title Pt will demo improved Lt hip extensor and flexor strength to 5/5 MMT, to improve technique with sit to stand and other functional activity.   Time 4   Period Weeks   Status New     PT LONG TERM GOAL #3   Title Pt will demonstrate understanding of the importance of daily physical activity, evident by his ability to walk for atleast 30 min without issue, to prepare for his return to work.    Time 4   Period Weeks   Status New               Plan - 02/09/16 1748    Clinical Impression Statement Pt is a 41 yo M referred to OPPT s/p L5/S1 hemi-laminectomy, discectomy on 12/17/15. He presents with minimal limitations in BLE strength, trunk strength and pain reported with over exertion. He is hoping to return to work by the end of this month assuming the surgeon clears him for this. His largest limitations are his postural limitations and mechanics with supine to sit and lifting technique. He would benefit from several sessions of skilled PT to address his limitations and improve posture, mechanics, etc. to allow him to safely return to work. Eval findings were discussed with the pt and his HEP was implemented. He verbalized agreement with proposed PT POC and frequency and was able to demonstrate proper technique with his HEP.   Rehab Potential Good   PT Frequency 1x / week   PT Duration 4 weeks   PT Treatment/Interventions ADLs/Self Care Home Management;Stair training;Therapeutic activities;Therapeutic exercise;Neuromuscular re-education;Patient/family education;Orthotic Fit/Training;Manual techniques;Scar mobilization;Dry needling   PT Next Visit Plan progression of  lumbar stabilization from supine to quad/sitting position if able to tolerate without significant increase in pain. follow up with daily walking    PT Home Exercise Plan pictures to remind pt of proper sitting posture, lumbar roll, desk ergonomics and lifting technique, BLE bridge in supine 2x10 reps, forward step ups    Consulted and Agree with Plan of Care Patient      Patient will benefit from skilled therapeutic intervention in order to improve the following deficits and impairments:  Decreased activity tolerance, Decreased mobility, Decreased scar mobility, Decreased strength, Postural dysfunction, Pain, Improper body mechanics  Visit Diagnosis: Low back pain, unspecified back pain laterality, unspecified chronicity, with sciatica presence unspecified - Plan: PT plan of care cert/re-cert  Muscle weakness (generalized) - Plan: PT plan of care cert/re-cert  Abnormal posture - Plan: PT plan of care cert/re-cert     Problem List Patient Active Problem List   Diagnosis Date Noted  . Spinal stenosis, lumbar 12/17/2015  . Perianal abscess 05/01/2013    6:08 PM,02/09/16 Elly Modena PT, DPT [facility] [place] [address] [place], [place], [address] Phone: [phone]   Fax:  [phone]  Name: Bobby Porter MRN: [id number] Date of Birth: July 29, 1975

## 2016-02-16 ENCOUNTER — Ambulatory Visit (HOSPITAL_COMMUNITY): Payer: BLUE CROSS/BLUE SHIELD | Admitting: Physical Therapy

## 2016-02-16 DIAGNOSIS — M545 Low back pain: Secondary | ICD-10-CM

## 2016-02-16 DIAGNOSIS — M6281 Muscle weakness (generalized): Secondary | ICD-10-CM

## 2016-02-16 DIAGNOSIS — R293 Abnormal posture: Secondary | ICD-10-CM

## 2016-02-16 NOTE — Therapy (Addendum)
[place], [place], [address] Phone: [phone]   Fax:  [phone]  Physical Therapy Treatment/Discharge  Patient Details  Name: Bobby Porter MRN: [id number] Date of Birth: Sep 22, 1975 Referring Provider: Melina Schools, MD  Encounter Date: 02/16/2016      PT End of Session - 02/16/16 1103    Visit Number 2   Number of Visits 4   Date for PT Re-Evaluation 03/08/16   Authorization Type BCBS   Authorization Time Period 02/09/16 to 03/08/16   PT Start Time 1035   PT Stop Time 1100   PT Time Calculation (min) 25 min   Activity Tolerance No increased pain;Patient tolerated treatment well   Behavior During Therapy [facility] for tasks assessed/performed      Past Medical History:  Diagnosis Date  . Anxiety   . Depression   . Gallstones   . GERD (gastroesophageal reflux disease)   . History of kidney stones   . Hypertension   . Pituitary tumor     Past Surgical History:  Procedure Laterality Date  . HEMI-MICRODISCECTOMY LUMBAR LAMINECTOMY LEVEL 1 Bilateral 12/17/2015   Procedure: HEMI-LAMINECTOMY DISCECTOMY L5 - S1 BILATERAL  LEVEL 1;  Surgeon: Melina Schools, MD;  Location: [place];  Service: Orthopedics;  Laterality: Bilateral;  . INCISION AND DRAINAGE PERIRECTAL ABSCESS N/A 05/03/2013   Procedure: IRRIGATION AND DEBRIDEMENT PERIRECTAL ABSCESS;  Surgeon: Jamesetta So, MD;  Location: [facility];  Service: General;  Laterality: N/A;    There were no vitals filed for this visit.      Subjective Assessment - 02/16/16 1103    Subjective Pt states he is doing his exercises and is only having a little soreness in his central Low back.  States he went shopping at [employer] for over an hour without difficulty.     Currently in Pain? No/denies                         [facility] Adult PT Treatment/Exercise - 02/16/16 0001      Transfers   Comments logroll technique supine to/from sit     Lumbar Exercises:  Stretches   Active Hamstring Stretch 2 reps;20 seconds   Active Hamstring Stretch Limitations long sitting     Lumbar Exercises: Supine   Ab Set 10 reps   Bridge 15 reps   Bridge Limitations x1 set with BLE, x1 set with RLE offset to encourage more weight on Lt.    Straight Leg Raise 10 reps   Straight Leg Raises Limitations with abdominal stab, slow controlled     Lumbar Exercises: Prone   Straight Leg Raise 10 reps   Straight Leg Raises Limitations bilaterally                PT Education - 02/16/16 1058    Education provided Yes   Education Details reviewed HEP, goals per intial evaluation and educated on logroll technique, posture and stretching.    Person(s) Educated Patient   Methods Explanation;Demonstration;Tactile cues;Verbal cues;Handout   Comprehension Verbalized understanding;Returned demonstration;Verbal cues required;Tactile cues required;Need further instruction          PT Short Term Goals - 02/09/16 1658      PT SHORT TERM GOAL #1   Title Pt will demo consistency and independence with his HEP to improve overall wellness and strength.    Time 1   Period Weeks   Status New  PT Long Term Goals - 02/09/16 1659      PT LONG TERM GOAL #1   Title Pt will demo proper performance of log roll technique during supine to sit transitions during his sessions without cuing from the therapist, to decrease strain on his back throughout the day.    Time 4   Period Weeks   Status New     PT LONG TERM GOAL #2   Title Pt will demo improved Lt hip extensor and flexor strength to 5/5 MMT, to improve technique with sit to stand and other functional activity.   Time 4   Period Weeks   Status New     PT LONG TERM GOAL #3   Title Pt will demonstrate understanding of the importance of daily physical activity, evident by his ability to walk for atleast 30 min without issue, to prepare for his return to work.    Time 4   Period Weeks   Status New                Plan - 02/16/16 1104    Clinical Impression Statement Continued with focus on lumbar stability per POC.  Provided continued education on posturing and mobilty to improve technique for safety and further injury prevention.  Pt able to demonstrate established HEP correctly and independently as well as complete logroll technique correctly after cues.  Added prone hamstring curls (slow and controlled with stab), hip extension and abdominal isometrics.  Pt also with noted hamstring tightness, adding long sitting hamstring stretch. Pt without increased pain during or after session.  Noted exertion and diaphoresis from therex, however.     Rehab Potential Good   PT Frequency 1x / week   PT Duration 4 weeks   PT Treatment/Interventions ADLs/Self Care Home Management;Stair training;Therapeutic activities;Therapeutic exercise;Neuromuscular re-education;Patient/family education;Orthotic Fit/Training;Manual techniques;Scar mobilization;Dry needling   PT Next Visit Plan progression of lumbar stabilization from supine to quad/sitting position if able to tolerate without significant increase in pain. encourage increased daily walking.  discuss work duties next session and discuss/review working Economist.    PT Home Exercise Plan pictures to remind pt of proper sitting posture, lumbar roll, desk ergonomics and lifting technique, BLE bridge in supine 2x10 reps, forward step ups  1/9:  long sitting hamstring stretch   Consulted and Agree with Plan of Care Patient      Patient will benefit from skilled therapeutic intervention in order to improve the following deficits and impairments:  Decreased activity tolerance, Decreased mobility, Decreased scar mobility, Decreased strength, Postural dysfunction, Pain, Improper body mechanics  Visit Diagnosis: Low back pain, unspecified back pain laterality, unspecified chronicity, with sciatica presence unspecified  Muscle weakness  (generalized)  Abnormal posture     Problem List Patient Active Problem List   Diagnosis Date Noted  . Spinal stenosis, lumbar 12/17/2015  . Perianal abscess 05/01/2013    Teena Irani, PTA/CLT [phone]  02/16/2016, 11:10 AM  [place] [address] [place], [place], [address] Phone: [phone]   Fax:  [phone]  Name: KAIKOA MAGRO MRN: [id number] Date of Birth: 20-Apr-1975  PHYSICAL THERAPY DISCHARGE SUMMARY  Visits from Start of Care: 2  Current functional level related to goals / functional outcomes: See above for more details concerning pt's latest functional limitations.   Remaining deficits: See above for more details concerning pt's latest remaining deficits   Education / Equipment: See above for education provided by Roseanne Reno PTA Plan: Patient agrees to  discharge.  Patient goals were not met. Patient is being discharged due to not returning since the last visit.  ?????     Pt discharged due to attendance issues and failure to comply with office attendance policy. Attempted to notify pt and left a message concerning his discharge at this time.  5:01 PM,03/15/16 Elly Modena PT, DPT [facility] [phone]

## 2016-02-16 NOTE — Patient Instructions (Signed)
HIP: Hamstrings - Long Sitting    Place one leg on surface with knee straight. Lean forward keeping back straight. Hold _20__ seconds. _3__ reps per set, _2__ sets per day, _5__ days per week

## 2016-02-23 ENCOUNTER — Ambulatory Visit (HOSPITAL_COMMUNITY): Payer: BLUE CROSS/BLUE SHIELD | Admitting: Physical Therapy

## 2016-03-01 ENCOUNTER — Encounter (HOSPITAL_COMMUNITY): Payer: BLUE CROSS/BLUE SHIELD | Admitting: Physical Therapy

## 2016-03-01 ENCOUNTER — Telehealth (HOSPITAL_COMMUNITY): Payer: Self-pay | Admitting: Physical Therapy

## 2016-03-01 ENCOUNTER — Ambulatory Visit (HOSPITAL_COMMUNITY): Payer: BLUE CROSS/BLUE SHIELD | Admitting: Physical Therapy

## 2016-03-01 NOTE — Telephone Encounter (Signed)
No Show. LMOM reminding pt of missed appt. Reminded of next appointment (03/08/16 at 4pm) and encouraged him to call if unable to make this.   6:13 PM,03/01/16 [place] [phone]

## 2016-03-08 ENCOUNTER — Encounter (HOSPITAL_COMMUNITY): Payer: BLUE CROSS/BLUE SHIELD | Admitting: Physical Therapy

## 2016-03-08 ENCOUNTER — Ambulatory Visit (HOSPITAL_COMMUNITY): Payer: BLUE CROSS/BLUE SHIELD

## 2016-03-15 ENCOUNTER — Ambulatory Visit (HOSPITAL_COMMUNITY): Payer: BLUE CROSS/BLUE SHIELD | Attending: Orthopedic Surgery | Admitting: Physical Therapy

## 2016-03-15 ENCOUNTER — Telehealth (HOSPITAL_COMMUNITY): Payer: Self-pay | Admitting: Physical Therapy

## 2016-03-15 NOTE — Telephone Encounter (Signed)
Pt no show for today's appointment. Notified pt of no show policy and discharge at this time due to poor attendance. Reminded pt that he will need a new MD order to return to PT and encouraged him to call with questions/concerns.    4:42 PM,03/15/16 Elly Modena PT, [facility] [phone]

## 2017-01-28 IMAGING — US US ABDOMEN LIMITED
1 series · 14 of 25 positions shown · non-contrast
Comparison: None.

CLINICAL DATA: Intermittent epigastric pain for 1 month with
occasional nausea. Symptoms are mainly postprandial.

EXAM:
US ABDOMEN LIMITED - RIGHT UPPER QUADRANT

[Series 1: us abdomen limited · 0.25mm/px · 14 of 37 slices shown]
[im 1/37]
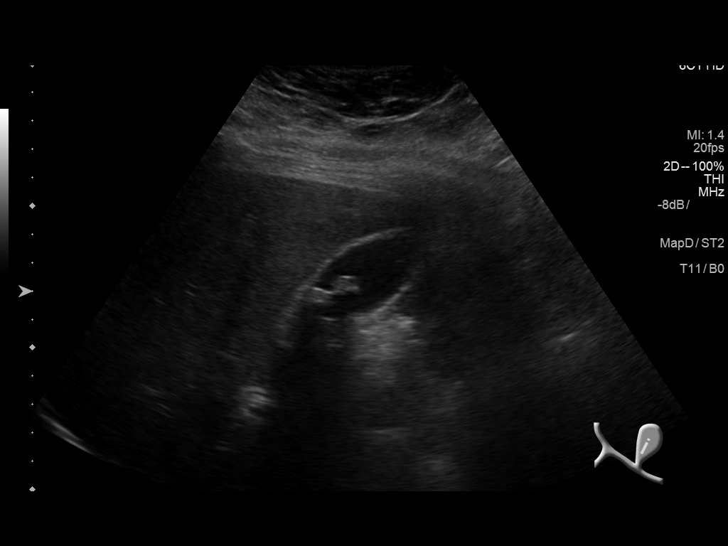
[im 4/37]
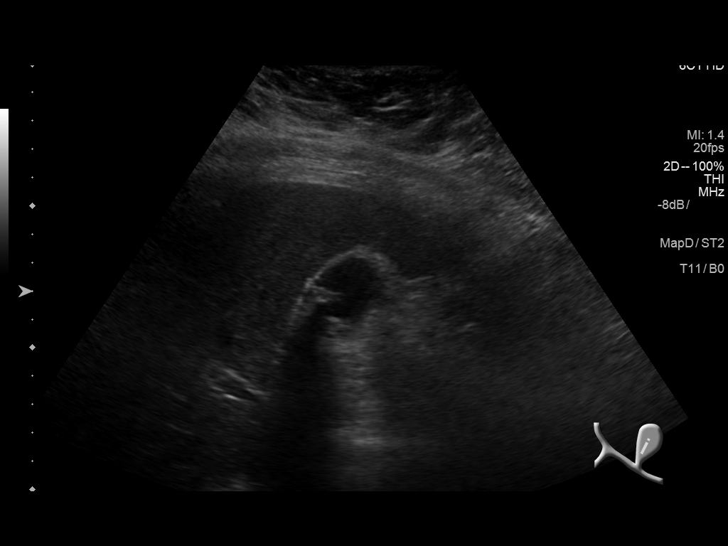
[im 7/37]
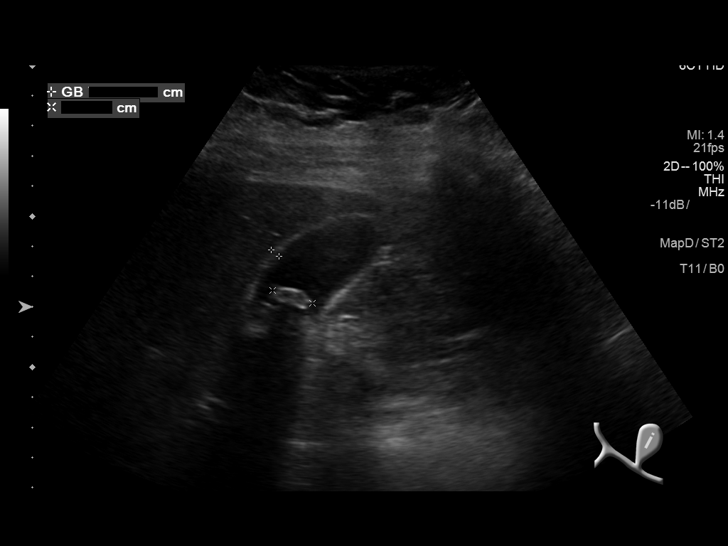
[im 10/37]
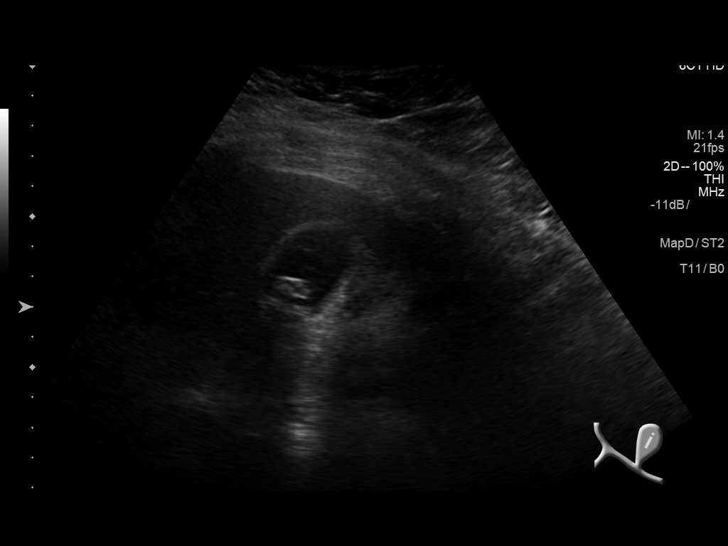
[im 13/37]
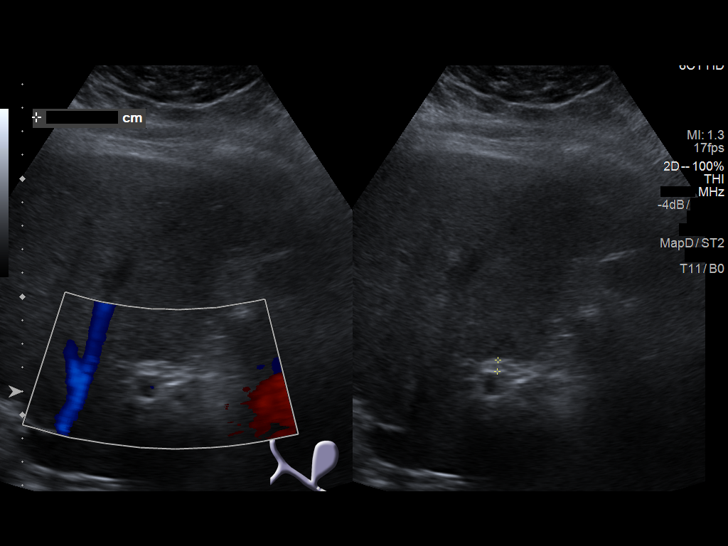
[im 14/37]
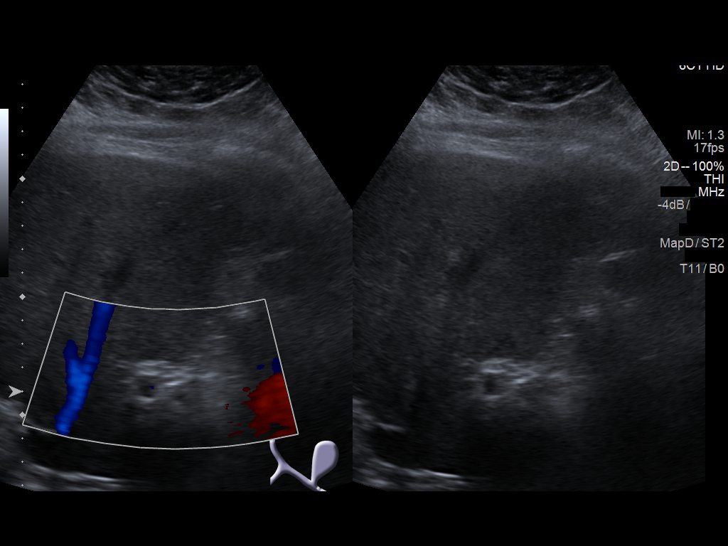
[im 17/37]
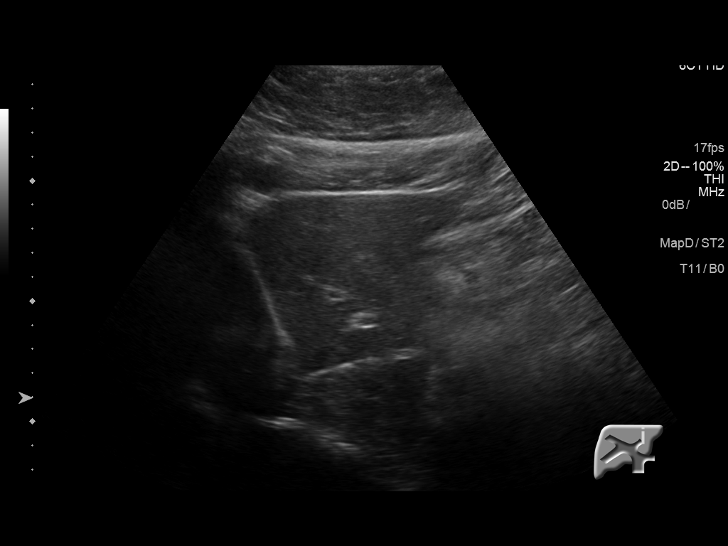
[im 20/37]
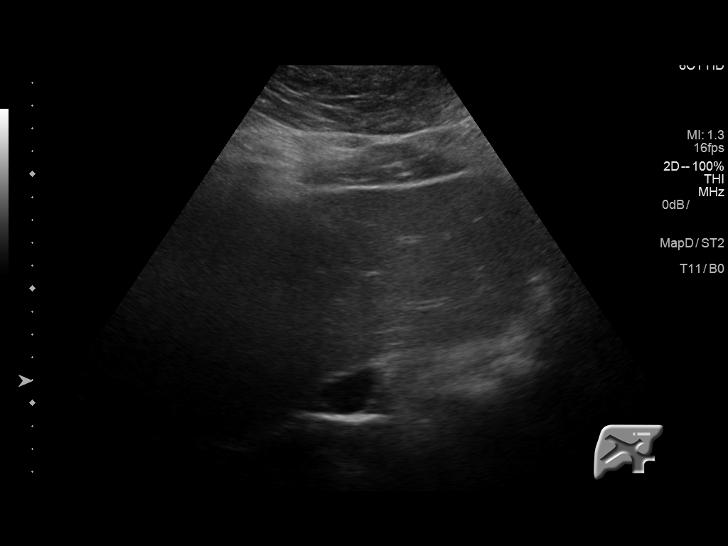
[im 23/37]
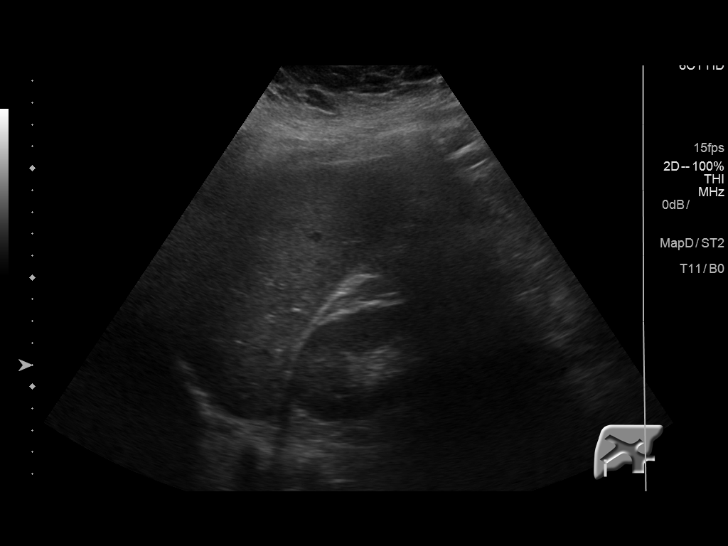
[im 25/37]
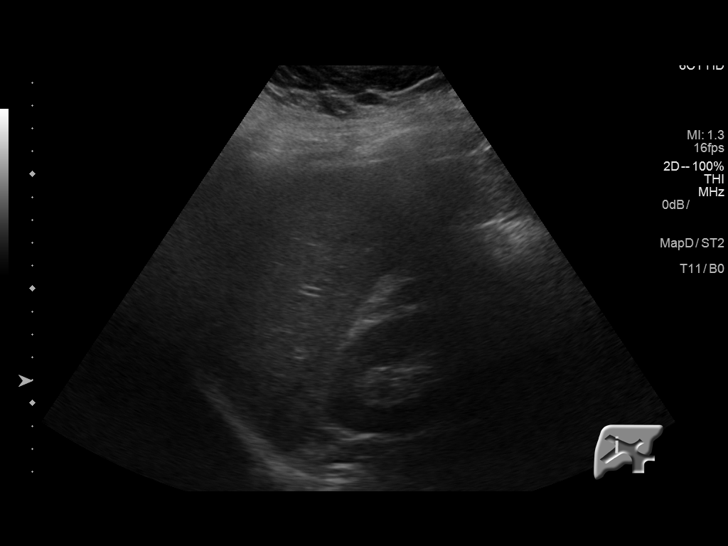
[im 28/37]
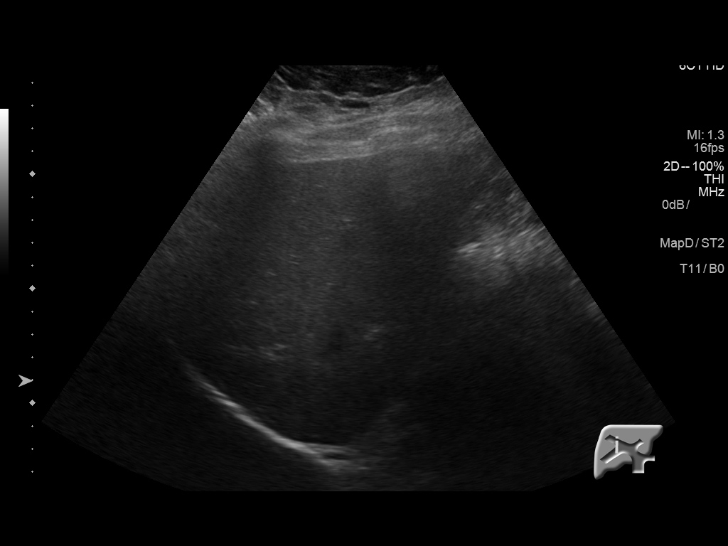
[im 31/37]
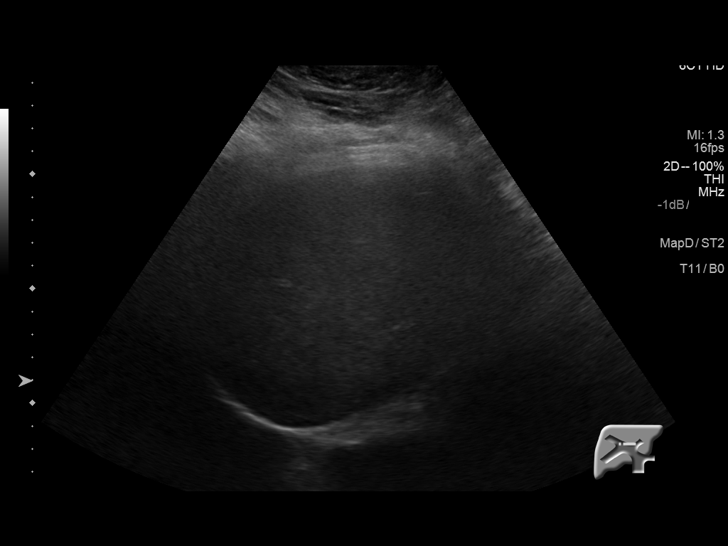
[im 34/37]
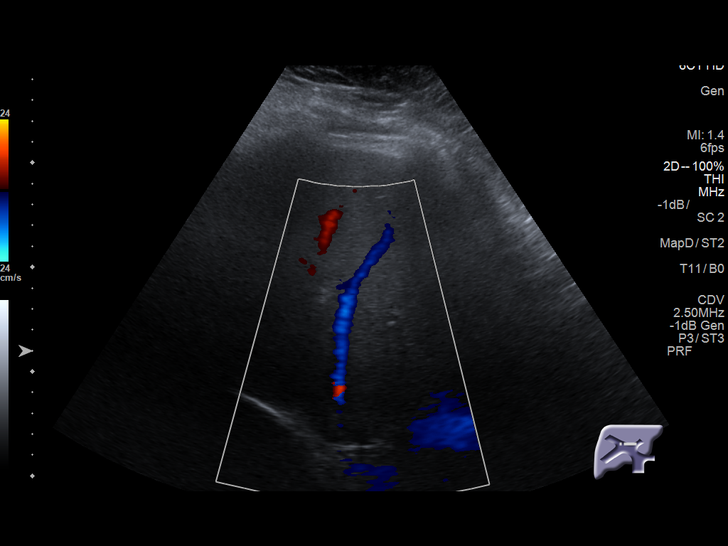
[im 37/37]
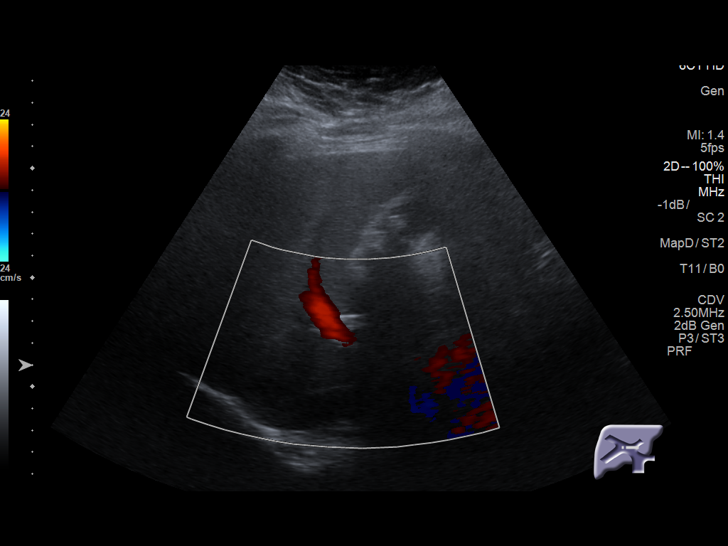

[14 of 25 positions shown; findings below may reference images not displayed]

FINDINGS: Gallbladder:

Multiple multiple shadowing gallstones are noted the gallbladder.
The largest calculus measures 1.4 cm. No gallbladder wall
thickening, pericholecystic fluid or sonographic Murphy sign to
suggest acute cholecystitis.

Common bile duct:

Diameter: 5.0 mm

Liver:

Normal echogenicity without focal lesion or biliary dilatation.
IMPRESSION: Cholelithiasis without sonographic findings for acute cholecystitis.
Normal caliber common bile duct.

Normal liver.

## 2017-04-02 IMAGING — CR DG LUMBAR SPINE 2-3V
1 series · 1 of 1 positions shown · non-contrast
Comparison: 05/26/2008.

CLINICAL DATA: Lumbar spine surgery.

EXAM:
LUMBAR SPINE - 2-3 VIEW

[lateral]
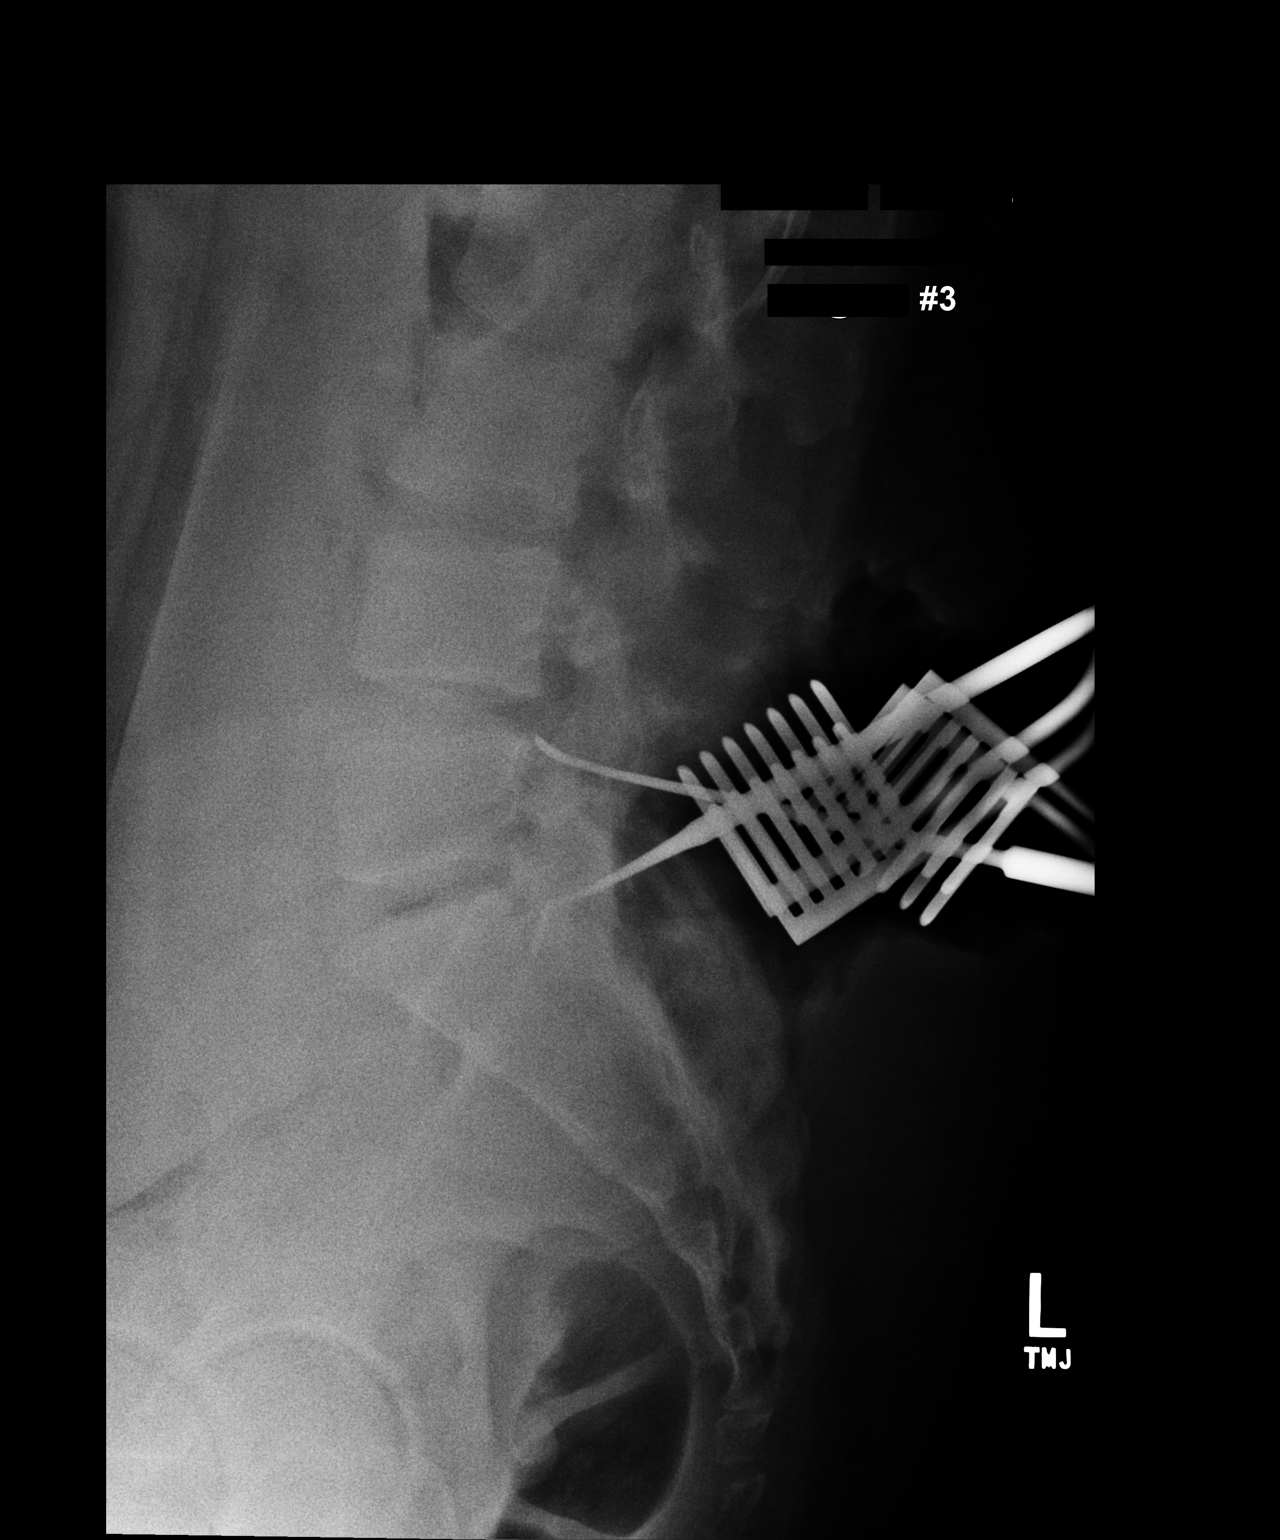

[1 of 1 positions shown; findings below may reference images not displayed]

FINDINGS: Lumbar vertebra numbered with the lowest lumbar shaped segmented
vertebra on lateral view as L5. Metallic markers noted posteriorly
at the L3-L4 and L5-S1 disc space levels.
IMPRESSION: Metallic markers noted posteriorly at the L3-L4 and L4-L5 disc space
levels.

## 2017-05-01 ENCOUNTER — Other Ambulatory Visit: Payer: Self-pay | Admitting: Internal Medicine

## 2017-05-01 DIAGNOSIS — E221 Hyperprolactinemia: Secondary | ICD-10-CM

## 2017-05-01 DIAGNOSIS — D352 Benign neoplasm of pituitary gland: Secondary | ICD-10-CM

## 2017-08-14 ENCOUNTER — Other Ambulatory Visit: Payer: BLUE CROSS/BLUE SHIELD

## 2018-03-15 DIAGNOSIS — E785 Hyperlipidemia, unspecified: Secondary | ICD-10-CM | POA: Diagnosis not present

## 2018-03-15 DIAGNOSIS — R51 Headache: Secondary | ICD-10-CM | POA: Diagnosis not present

## 2018-03-15 DIAGNOSIS — Z0001 Encounter for general adult medical examination with abnormal findings: Secondary | ICD-10-CM | POA: Diagnosis not present

## 2018-03-15 DIAGNOSIS — H538 Other visual disturbances: Secondary | ICD-10-CM | POA: Diagnosis not present

## 2018-03-15 DIAGNOSIS — D352 Benign neoplasm of pituitary gland: Secondary | ICD-10-CM | POA: Diagnosis not present

## 2018-03-15 DIAGNOSIS — I10 Essential (primary) hypertension: Secondary | ICD-10-CM | POA: Diagnosis not present

## 2018-03-30 DIAGNOSIS — E221 Hyperprolactinemia: Secondary | ICD-10-CM | POA: Diagnosis not present

## 2018-03-30 DIAGNOSIS — E782 Mixed hyperlipidemia: Secondary | ICD-10-CM | POA: Diagnosis not present

## 2018-03-30 DIAGNOSIS — I10 Essential (primary) hypertension: Secondary | ICD-10-CM | POA: Diagnosis not present

## 2018-04-06 DIAGNOSIS — J060 Acute laryngopharyngitis: Secondary | ICD-10-CM | POA: Diagnosis not present

## 2018-04-06 DIAGNOSIS — R05 Cough: Secondary | ICD-10-CM | POA: Diagnosis not present

## 2018-04-06 DIAGNOSIS — R509 Fever, unspecified: Secondary | ICD-10-CM | POA: Diagnosis not present

## 2018-06-22 DIAGNOSIS — M545 Low back pain: Secondary | ICD-10-CM | POA: Diagnosis not present

## 2019-05-06 DIAGNOSIS — J069 Acute upper respiratory infection, unspecified: Secondary | ICD-10-CM | POA: Diagnosis not present

## 2019-05-24 DIAGNOSIS — D352 Benign neoplasm of pituitary gland: Secondary | ICD-10-CM | POA: Diagnosis not present

## 2019-05-24 DIAGNOSIS — R369 Urethral discharge, unspecified: Secondary | ICD-10-CM | POA: Diagnosis not present

## 2019-05-24 DIAGNOSIS — M25532 Pain in left wrist: Secondary | ICD-10-CM | POA: Diagnosis not present

## 2019-05-24 DIAGNOSIS — B001 Herpesviral vesicular dermatitis: Secondary | ICD-10-CM | POA: Diagnosis not present

## 2019-05-24 DIAGNOSIS — E221 Hyperprolactinemia: Secondary | ICD-10-CM | POA: Diagnosis not present

## 2019-07-22 DIAGNOSIS — G5602 Carpal tunnel syndrome, left upper limb: Secondary | ICD-10-CM | POA: Diagnosis not present

## 2019-07-30 DIAGNOSIS — M542 Cervicalgia: Secondary | ICD-10-CM | POA: Diagnosis not present

## 2019-07-30 DIAGNOSIS — M5431 Sciatica, right side: Secondary | ICD-10-CM | POA: Diagnosis not present

## 2019-08-07 ENCOUNTER — Ambulatory Visit (HOSPITAL_COMMUNITY)
Admission: RE | Admit: 2019-08-07 | Discharge: 2019-08-07 | Disposition: A | Discharge status: Home / Self Care | Payer: BC Managed Care – PPO | Attending: Psychiatry | Admitting: Psychiatry

## 2019-08-07 NOTE — H&P (Signed)
Behavioral Health Medical Screening Exam  Bobby Porter is an 44 y.o. male. 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 He denies prior treatment. He admits to recent depressed mood, with occasional suicidal thoughts, no plan or intent. He denies SI at this time. Denies HI. Denies AVH and shows no signs of responding to internal stimuli. He denies prior psychiatric history.  Total Time spent with patient: 15 minutes  Psychiatric Specialty Exam: Physical Exam Vitals and nursing note reviewed.  Constitutional:      Appearance: He is well-developed.  Cardiovascular:     Rate and Rhythm: Normal rate.  Pulmonary:     Effort: Pulmonary effort is normal.  Neurological:     Mental Status: He is alert and oriented to person, place, and time.    Review of Systems  Constitutional: Negative.   Psychiatric/Behavioral: Positive for dysphoric mood. Negative for agitation, behavioral problems, confusion, decreased concentration, hallucinations, self-injury and suicidal ideas. The patient is not nervous/anxious and is not hyperactive.    Blood pressure (!) 122/93, pulse 75, temperature 98.6 F (37 C), temperature source Oral, resp. rate 18, SpO2 100 %.There is no height or weight on file to calculate BMI. General Appearance: Casual Eye Contact:  Good Speech:  Normal Rate Volume:  Normal Mood:  Depressed Affect:  Congruent Thought Process:  Coherent and Goal Directed Orientation:  Full (Time, Place, and Person) Thought Content:  Logical Suicidal Thoughts:  No Homicidal Thoughts:  No Memory:  Immediate;   Good Recent;   Good Remote;   Good Judgement:  Intact Insight:  Fair Psychomotor Activity:  Normal Concentration: Concentration: Good and Attention Span: Good Recall:  Good Fund of Knowledge:Fair Language: Good Akathisia:  No Handed:  Right AIMS (if indicated):    Assets:  Communication Skills Desire for Improvement Housing Resilience Sleep:      Musculoskeletal: Strength & Muscle Tone: within normal limits Gait & Station: normal Patient leans: N/A  Blood pressure (!) 122/93, pulse 75, temperature 98.6 F (37 C), temperature source Oral, resp. rate 18, SpO2 100 %.  Recommendations: Based on my evaluation the patient does not appear to have an emergency medical condition.  Outpatient referrals.  Connye Burkitt, NP 08/07/2019, 4:45 PM

## 2019-08-07 NOTE — BH Assessment (Signed)
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 Amount: 08/06/2019                     ASAM's:  Six Dimensions of Multidimensional Porter  Dimension 1:  Acute Intoxication and/or Withdrawal Potential:   Dimension 1:  Description of individual's past and current experiences of substance use and withdrawal: denied  Dimension 2:  Biomedical Conditions and Complications:   Dimension 2:  Description of patient's biomedical conditions and  complications: denied  Dimension 3:  Emotional, Behavioral, or Cognitive Conditions and Complications:     Dimension 4:  Readiness to Change:  Dimension 4:  Description of Readiness to Change criteria: Expresses change to stop smoking THC and  drinking alcohol  Dimension 5:  Relapse, Continued use, or Continued Problem Potential:  Dimension 5:  Relapse, continued use, or continued problem potential critiera description: smokes daily  Dimension 6:  Recovery/Living Environment:  Dimension 6:  Recovery/Iiving environment criteria description: living environment stressful (marital problems)  ASAM Severity Score: ASAM's Severity Rating Score: 7  ASAM Recommended Level of Treatment: ASAM Recommended Level of Treatment: Level I Outpatient Treatment (outpatient therapy)   Substance use Disorder (SUD) Substance Use Disorder (SUD)  Checklist Symptoms of Substance Use: Evidence of tolerance, Persistent desire or unsuccessful efforts to cut down or control use, Presence of craving or strong urge to use, Substance(s) often taken in larger amounts or over longer times than was intended  Recommendations for Services/Supports/Treatments: Recommendations for Services/Supports/Treatments Recommendations For Services/Supports/Treatments: Individual Therapy  DSM5 Diagnoses: Patient Active Problem List   Diagnosis Date Noted  . Spinal stenosis, lumbar 12/17/2015  . Perianal abscess 05/01/2013    Patient Centered Plan: Patient is  on the following Treatment Plan(s):  Depression   Referrals to Alternative Service(s): Referred to Alternative Service(s):   Place:   Date:   Time:    Referred to Alternative Service(s):   Place:   Date:   Time:    Referred to Alternative Service(s):   Place:   Date:   Time:    Referred to Alternative Service(s):   Place:   Date:   Time:     Bobby Porter (CCA) Screening, Triage and Referral Note  08/07/2019 Bobby Porter [id number]  Visit Diagnosis: No diagnosis found.  Patient Reported Information How did you hear about [place]? Other (Comment) (walk-in)   Referral name: Oaklan Persons   Referral phone number: [phone])  Whom do you  see for routine medical problems? Other (Comment) (Patient is self-referral)   Practice/Facility Name: No data recorded  Practice/Facility Phone Number: No data recorded  Name of Contact: No data recorded  Contact Number: No data recorded  Contact Fax Number: No data recorded  Prescriber Name: No data recorded  Prescriber Address (if known): No data recorded What Is the Reason for Your Visit/Call Today? Patient reports depressive symptoms, cannabis addiction, and sex addiction concerns  How Long Has This Been Causing You Problems? > than 6 months (Report depressive symptoms started 01/2019, started smoking THC age 38 and no specific time frame for concerns related to sex addiction)  Have You Recently Been in Any Inpatient Treatment (Hospital/Detox/Crisis Center/28-Day Program)? No (denies)   Name/Location of Program/Hospital:No data recorded  How Long Were You There? No data recorded  When Were You Discharged? No data recorded Have You Ever Received Services From [facility] Before? No   Who Do You See at [facility]? No data recorded Have You Recently Had Any Thoughts About Hurting Yourself? Yes (Report 08/06/2019, suicidal thoughts with no plan)   Are You Planning to Commit Suicide/Harm Yourself At This time?  No  Have you Recently Had Thoughts About [place]? No   Explanation: No data recorded Have You Used Any Alcohol or Drugs in the Past 24 Hours? Yes (Reports smokes THC daily, had a jello shot last night 08/06/2019)   How Long Ago Did You Use Drugs or Alcohol?  No data recorded  What Did You Use and How Much? Report drinks couple shots/beers 1x week, THC daily  What Do You Feel Would Help You the Most Today? Other (Comment) (link with an outpatient patient therapist)  Do You Currently Have a Therapist/Psychiatrist? No   Name of Therapist/Psychiatrist: No data recorded  Have You Been Recently Discharged From Any Office Practice or Programs? No   Explanation of  Discharge From Practice/Program:  No data recorded    CCA Screening Triage Referral Porter Type of Contact: Face-to-Face   Is this Initial or Reassessment? No data recorded  Date Telepsych consult ordered in [facility]:  No data recorded  Time Telepsych consult ordered in [facility]:  No data recorded Patient Reported Information Reviewed? Yes   Patient Left Without Being Seen? No data recorded  Reason for Not Completing Porter: No data recorded Collateral Involvement: n/a  Does Patient Have a Court Appointed Legal Guardian? No data recorded  Name and Contact of Legal Guardian:  No data recorded If Minor and Not Living with Parent(s), Who has Custody? No data recorded Is CPS involved or ever been involved? Never  Is APS involved or ever been involved? Never  Patient Determined To Be At Risk for Harm  To Self or Others Based on Review of Patient Reported Information or Presenting Complaint? No   Method: No data recorded  Availability of Means: No data recorded  Intent: No data recorded  Notification Required: No data recorded  Additional Information for Danger to Others Potential:  No data recorded  Additional Comments for Danger to Others Potential:  No data recorded  Are There Guns or Other Weapons in Your Home?  No data recorded   Types of Guns/Weapons: No data recorded   Are These Weapons Safely Secured?                              No data recorded   Who Could Verify You Are Able To Have These Secured:    No data recorded Do You Have any Outstanding Charges, Pending Court Dates, Parole/Probation? No data recorded Contacted To Inform of Risk of Harm To Self or Others: Other: Comment (non-applicable)  Location of Porter: [facility] Walk-in)  Does Patient Present under Involuntary Commitment? No   IVC Papers Initial File Date: No data recorded  [place] of Residence: [place]  Patient Currently Receiving the Following Services: No data  recorded  Determination of Need: Routine (7 days) (outpatient serivces)   Options For Referral: Outpatient Therapy (Referred to [facility])   [facility], LCAS

## 2019-08-15 ENCOUNTER — Ambulatory Visit (HOSPITAL_COMMUNITY)
Admission: EM | Admit: 2019-08-15 | Discharge: 2019-08-16 | Disposition: A | Discharge status: Home / Self Care | Payer: BC Managed Care – PPO | Attending: Family | Admitting: Family

## 2019-08-15 ENCOUNTER — Other Ambulatory Visit: Payer: Self-pay

## 2019-08-15 ENCOUNTER — Ambulatory Visit (HOSPITAL_COMMUNITY)
Admission: RE | Admit: 2019-08-15 | Discharge: 2019-08-15 | Disposition: A | Discharge status: Home / Self Care | Payer: BC Managed Care – PPO | Source: Home / Self Care | Attending: Psychiatry | Admitting: Psychiatry

## 2019-08-15 ENCOUNTER — Encounter (HOSPITAL_COMMUNITY): Payer: Self-pay

## 2019-08-15 DIAGNOSIS — F419 Anxiety disorder, unspecified: Secondary | ICD-10-CM | POA: Diagnosis not present

## 2019-08-15 DIAGNOSIS — Z79899 Other long term (current) drug therapy: Secondary | ICD-10-CM | POA: Diagnosis not present

## 2019-08-15 DIAGNOSIS — Z87442 Personal history of urinary calculi: Secondary | ICD-10-CM | POA: Diagnosis not present

## 2019-08-15 DIAGNOSIS — Z62811 Personal history of psychological abuse in childhood: Secondary | ICD-10-CM | POA: Diagnosis not present

## 2019-08-15 DIAGNOSIS — Z20822 Contact with and (suspected) exposure to covid-19: Secondary | ICD-10-CM | POA: Diagnosis not present

## 2019-08-15 DIAGNOSIS — F17210 Nicotine dependence, cigarettes, uncomplicated: Secondary | ICD-10-CM | POA: Diagnosis not present

## 2019-08-15 DIAGNOSIS — R45851 Suicidal ideations: Secondary | ICD-10-CM | POA: Insufficient documentation

## 2019-08-15 DIAGNOSIS — F4323 Adjustment disorder with mixed anxiety and depressed mood: Secondary | ICD-10-CM | POA: Insufficient documentation

## 2019-08-15 DIAGNOSIS — K219 Gastro-esophageal reflux disease without esophagitis: Secondary | ICD-10-CM | POA: Diagnosis not present

## 2019-08-15 DIAGNOSIS — I10 Essential (primary) hypertension: Secondary | ICD-10-CM | POA: Diagnosis not present

## 2019-08-15 DIAGNOSIS — Z62810 Personal history of physical and sexual abuse in childhood: Secondary | ICD-10-CM | POA: Insufficient documentation

## 2019-08-15 LAB — COMPREHENSIVE METABOLIC PANEL
ALT: 23 U/L (ref 0–44)
AST: 16 U/L (ref 15–41)
Albumin: 3.9 g/dL (ref 3.5–5.0)
Alkaline Phosphatase: 91 U/L (ref 38–126)
Anion gap: 10 (ref 5–15)
BUN: 10 mg/dL (ref 6–20)
CO2: 24 mmol/L (ref 22–32)
Calcium: 9.1 mg/dL (ref 8.9–10.3)
Chloride: 104 mmol/L (ref 98–111)
Creatinine, Ser: 0.92 mg/dL (ref 0.61–1.24)
GFR calc Af Amer: >60 mL/min (ref >60)
GFR calc non Af Amer: >60 mL/min (ref >60)
Glucose, Bld: 93 mg/dL (ref 70–99)
Potassium: 3.5 mmol/L (ref 3.5–5.1)
Sodium: 138 mmol/L (ref 135–145)
Total Bilirubin: 0.8 mg/dL (ref 0.3–1.2)
Total Protein: 7 g/dL (ref 6.5–8.1)

## 2019-08-15 LAB — POC SARS CORONAVIRUS 2 AG: SARS Coronavirus 2 Ag: NEGATIVE

## 2019-08-15 LAB — ETHANOL: Alcohol, Ethyl (B): <10 mg/dL (ref <10)

## 2019-08-15 LAB — POCT URINE DRUG SCREEN - MANUAL ENTRY (I-SCREEN)
POC Cocaine UR: NOT DETECTED
POC Marijuana UR: POSITIVE — AB
POC Methamphetamine UR: NOT DETECTED
POC Morphine: NOT DETECTED
POC Oxazepam (BZO): NOT DETECTED
POC Oxycodone UR: NOT DETECTED
POC Secobarbital (BAR): NOT DETECTED

## 2019-08-15 LAB — LIPID PANEL
Cholesterol: 249 mg/dL — ABNORMAL HIGH (ref 0–200)
HDL: 43 mg/dL (ref >40)
LDL Cholesterol: 181 mg/dL — ABNORMAL HIGH (ref 0–99)
Total CHOL/HDL Ratio: 5.8 RATIO
Triglycerides: 123 mg/dL (ref <150)
VLDL: 25 mg/dL (ref 0–40)

## 2019-08-15 LAB — POCT URINE DRUG SCREEN - MANUAL ENTRY (I-CUP)
POC Amphetamine UR: NOT DETECTED
POC Buprenorphine (BUP): NOT DETECTED
POC Methadone UR: NOT DETECTED

## 2019-08-15 LAB — GLUCOSE, CAPILLARY: Glucose-Capillary: 105 mg/dL — ABNORMAL HIGH (ref 70–99)

## 2019-08-15 LAB — MAGNESIUM: Magnesium: 2.3 mg/dL (ref 1.7–2.4)

## 2019-08-15 LAB — TSH: TSH: 1.817 u[IU]/mL (ref 0.350–4.500)

## 2019-08-15 MED ORDER — ALUM & MAG HYDROXIDE-SIMETH 200-200-20 MG/5ML PO SUSP: 30.0000 mL | ORAL | Status: DC | PRN

## 2019-08-15 MED ORDER — ACETAMINOPHEN 325 MG PO TABS: 650.0000 mg | ORAL_TABLET | Freq: Four times a day (QID) | ORAL | Status: DC | PRN

## 2019-08-15 MED ORDER — MAGNESIUM HYDROXIDE 400 MG/5ML PO SUSP: 30.0000 mL | Freq: Every day | ORAL | Status: DC | PRN

## 2019-08-15 MED ORDER — TRAZODONE HCL 50 MG PO TABS
50.0000 mg | ORAL_TABLET | Freq: Every day | ORAL | Status: DC
  Administered 2019-08-15: 50 mg via ORAL
  Filled 2019-08-15 (×2): qty 1

## 2019-08-15 MED ORDER — TRAZODONE HCL 50 MG PO TABS
50.0000 mg | ORAL_TABLET | Freq: Every evening | ORAL | Status: DC | PRN
  Administered 2019-08-15: 50 mg via ORAL
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
## 2019-08-15 NOTE — ED Notes (Signed)
Patient just arriving to unit. Patient cooperative.  Patient working with nurse for stabilization.
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
## 2019-08-15 NOTE — ED Triage Notes (Signed)
Received Marlow from [facility], states he is feeling depressed with past thoughts of suicidal. He reports stressors, such as separation from his family. He verbalized excessive bills and feels like things ar closing in on him.

## 2019-08-15 NOTE — ED Notes (Signed)
Pt belongings in locker #15

## 2019-08-15 NOTE — ED Notes (Signed)
Pt alert, oriented and ambulating with no issues on the unit. Pt denies SI/HI, A/VH, and any pain. Pt is currently resting on his chair/bed, pleasant and cooperative. Education, support and encouragement provided. Pt denies any concerns at this time, and verbally contracts for safety. Pt remains safe on the unit.

## 2019-08-16 MED ORDER — CLONIDINE HCL 0.1 MG PO TABS
0.1000 mg | ORAL_TABLET | Freq: Once | ORAL | Status: AC
  Administered 2019-08-16: 0.1 mg via ORAL
  Filled 2019-08-16: qty 1

## 2019-08-16 NOTE — Discharge Instructions (Addendum)
Return if symptoms worsen Keep scheduled appointments

## 2019-08-16 NOTE — ED Provider Notes (Signed)
FBC/OBS ASAP Discharge Summary  Date and Time: 08/16/2019 8:21 AM  Name: Bobby Porter  MRN:  [id number]   Discharge Diagnoses:  Final diagnoses:  Adjustment disorder with mixed anxiety and depressed mood    Subjective: Patient reports today that he is doing okay.  He reports that he realizes that he is in a low point in his life.  He states that he has been in a relationship with his wife for 8 years and based off his description it is pretty toxic.  He states that she continually blames him for situations and issues even if he has not been around her not even remotely responsible.  He states that she agreed to support him while he went back to school and he starts back to nursing school in 2 weeks.  He states they have also purchased a house that is being built and should be ready around September to November.  He reports that he has treated his stepchildren like his own and wants the best for them and has been trying to care for them so that all have the same issues that the mother does.  He states that his wife has a lot of mental health issues and is pushing and onto the children as well as him.  He states that she is a Marine scientist as well.  He denies any suicidal homicidal Haitians and denies any hallucinations.  He states that he wants to get set up with therapy but his insurance runs out in 2 weeks.  He states that currently he has been staying at the house by himself and being alone has not been the best thing for him.  He agrees to go and stay with his mother if he is discharged today. Collateral information was gained from patient's sister.  She reports that she was just concerned about the patient and that he can go stay with their mother.  She states that he has been depressed since his wife left with the children and he has a lot of stressors of dealing with school as well as with his house being built.  She states that they are unsure of what is going to happen next but she just wants to make  sure he is safe.  Stay Summary: Patient is a 44 year old male that presented with passive suicidal ideations due to situations with his wife leaving as well as having a new house built and starting nursing school in 2 weeks.  Patient was requesting to leave yesterday but was advised to stay due to risks and concerns.  Patient remained in a continuous observation unit last night voluntary and refused to start any medications at this time and stated that he needed to have therapy.  Safety plan was established with patient's sister and an appointment was made with the [facility] behavioral [facility] center for July 29 at 4 PM.  Patient did state that he wanted appointments made on Tuesdays or Thursdays in the evening or afternoon due to school.  Total Time spent with patient: 20 minutes  Past Psychiatric History: None reported. Patient denies any previous hospitalizations, denies previous suicide attempts, denies any previous psychotropic medications Past Medical History:  Past Medical History:  Diagnosis Date  . Anxiety   . Depression   . Gallstones   . GERD (gastroesophageal reflux disease)   . History of kidney stones   . Hypertension   . Pituitary tumor     Past Surgical History:  Procedure Laterality Date  .  HEMI-MICRODISCECTOMY LUMBAR LAMINECTOMY LEVEL 1 Bilateral 12/17/2015   Procedure: HEMI-LAMINECTOMY DISCECTOMY L5 - S1 BILATERAL  LEVEL 1;  Surgeon: Melina Schools, MD;  Location: [place];  Service: Orthopedics;  Laterality: Bilateral;  . INCISION AND DRAINAGE PERIRECTAL ABSCESS N/A 05/03/2013   Procedure: IRRIGATION AND DEBRIDEMENT PERIRECTAL ABSCESS;  Surgeon: Jamesetta So, MD;  Location: [facility];  Service: General;  Laterality: N/A;   Family History:  Family History  Problem Relation Age of Onset  . Heart disease Mother   . Thyroid disease Mother   . Hypertension Mother   . Prostate cancer Father   . Heart disease Father    Family Psychiatric History: None reported Social  History:  Social History   Substance and Sexual Activity  Alcohol Use Yes   Comment: rarely     Social History   Substance and Sexual Activity  Drug Use Yes  . Types: Marijuana   Comment: occasional    Social History   Socioeconomic History  . Marital status: Married    Spouse name: Not on file  . Number of children: Not on file  . Years of education: Not on file  . Highest education level: Not on file  Occupational History  . Not on file  Tobacco Use  . Smoking status: Current Some Day Smoker    Packs/day: 0.25    Types: Cigarettes  . Smokeless tobacco: Never Used  Substance and Sexual Activity  . Alcohol use: Yes    Comment: rarely  . Drug use: Yes    Types: Marijuana    Comment: occasional  . Sexual activity: Not on file  Other Topics Concern  . Not on file  Social History Narrative  . Not on file   Social Determinants of Health   Financial Resource Strain:   . Difficulty of Paying Living Expenses:   Food Insecurity:   . Worried About Charity fundraiser in the Last Year:   . Arboriculturist in the Last Year:   Transportation Needs:   . Film/video editor (Medical):   Marland Kitchen Lack of Transportation (Non-Medical):   Physical Activity:   . Days of Exercise per Week:   . Minutes of Exercise per Session:   Stress:   . Feeling of Stress :   Social Connections:   . Frequency of Communication with Friends and Family:   . Frequency of Social Gatherings with Friends and Family:   . Attends Religious Services:   . Active Member of Clubs or Organizations:   . Attends Archivist Meetings:   Marland Kitchen Marital Status:    SDOH:  SDOH Screenings   Alcohol Screen:   . Last Alcohol Screening Score (AUDIT):   Depression (PHQ2-9):   . PHQ-2 Score:   Financial Resource Strain:   . Difficulty of Paying Living Expenses:   Food Insecurity:   . Worried About Charity fundraiser in the Last Year:   . [place] in the Last Year:   Housing:   . Last Housing  Risk Score:   Physical Activity:   . Days of Exercise per Week:   . Minutes of Exercise per Session:   Social Connections:   . Frequency of Communication with Friends and Family:   . Frequency of Social Gatherings with Friends and Family:   . Attends Religious Services:   . Active Member of Clubs or Organizations:   . Attends Archivist Meetings:   Marland Kitchen Marital Status:   Stress:   .  Feeling of Stress :   Tobacco Use: High Risk  . Smoking Tobacco Use: Current Some Day Smoker  . Smokeless Tobacco Use: Never Used  Transportation Needs:   . Film/video editor (Medical):   Marland Kitchen Lack of Transportation (Non-Medical):     Has this patient used any form of tobacco in the last 30 days? (Cigarettes, Smokeless Tobacco, Cigars, and/or Pipes) Prescription not provided because: doesn't smoke  Current Medications:  Current Facility-Administered Medications  Medication Dose Route Frequency Provider Last Rate Last Admin  . acetaminophen (TYLENOL) tablet 650 mg  650 mg Oral Q6H PRN Emmaline Kluver, FNP      . alum & mag hydroxide-simeth (MAALOX/MYLANTA) 200-200-20 MG/5ML suspension 30 mL  30 mL Oral Q4H PRN Emmaline Kluver, FNP      . magnesium hydroxide (MILK OF MAGNESIA) suspension 30 mL  30 mL Oral Daily PRN Emmaline Kluver, FNP      . traZODone (DESYREL) tablet 50 mg  50 mg Oral QHS,MR X 1 Rankin, Shuvon B, NP   50 mg at 08/15/19 2334   Current Outpatient Medications  Medication Sig Dispense Refill  . amLODipine (NORVASC) 5 MG tablet Take 5 mg by mouth daily.    . cabergoline (DOSTINEX) 0.5 MG tablet Take 0.25 mg by mouth 2 (two) times a week. Takes on Tuesday and Saturday.    . methocarbamol (ROBAXIN) 500 MG tablet Take 1 tablet (500 mg total) by mouth 3 (three) times daily as needed for muscle spasms. 60 tablet 0  . ondansetron (ZOFRAN) 4 MG tablet Take 1 tablet (4 mg total) by mouth every 8 (eight) hours as needed for nausea or vomiting. 20 tablet 0  . oxyCODONE-acetaminophen (PERCOCET)  10-325 MG tablet Take 1 tablet by mouth every 4 (four) hours as needed for pain. 60 tablet 0    PTA Medications: (Not in a hospital admission)   Musculoskeletal  Strength & Muscle Tone: within normal limits Gait & Station: normal Patient leans: N/A  Psychiatric Specialty Exam  Presentation  General Appearance: Casual;Appropriate for Environment  Eye Contact:Good  Speech:Clear and Coherent;Normal Rate  Speech Volume:Normal  Handedness:Right   Mood and Affect  Mood:Depressed  Affect:Congruent;Depressed   Thought Process  Thought Processes:Coherent  Descriptions of Associations:Intact  Orientation:Full (Time, Place and Person)  Thought Content:Logical  Hallucinations:Hallucinations: None  Ideas of Reference:None  Suicidal Thoughts:Suicidal Thoughts: No  Homicidal Thoughts:Homicidal Thoughts: No   Sensorium  Memory:Immediate Good;Recent Good;Remote Good  Judgment:Good  Insight:Fair   Executive Functions  Concentration:Good  Attention Span:Good  [place] of Knowledge:Good  Language:Good   Psychomotor Activity  Psychomotor Activity:Psychomotor Activity: Normal   Assets  Assets:Communication Skills;Desire for Improvement;Financial Resources/Insurance;Housing;Physical Health;Social Support;Transportation   Sleep  Sleep:Sleep: Good   Physical Exam  Physical Exam Vitals and nursing note reviewed.  Constitutional:      Appearance: He is well-developed.  Cardiovascular:     Rate and Rhythm: Normal rate.  Pulmonary:     Effort: Pulmonary effort is normal.  Musculoskeletal:        General: Normal range of motion.  Skin:    General: Skin is warm.  Neurological:     Mental Status: He is alert and oriented to person, place, and time.  Psychiatric:        Mood and Affect: Mood is depressed.        Thought Content: Thought content does not include homicidal or suicidal ideation.    Review of Systems  Constitutional: Negative.    HENT: Negative.  Eyes: Negative.   Respiratory: Negative.   Cardiovascular: Negative.   Gastrointestinal: Negative.   Genitourinary: Negative.   Musculoskeletal: Negative.   Skin: Negative.   Neurological: Negative.   Endo/Heme/Allergies: Negative.   Psychiatric/Behavioral: Positive for depression. Negative for hallucinations, substance abuse and suicidal ideas.   Blood pressure (!) 155/108, pulse (!) 50, resp. rate 17, height 6\' 2"  (1.88 m), weight (!) 325 lb (147.4 kg), SpO2 100 %. Body mass index is 41.73 kg/m.  Demographic Factors:  Male  Loss Factors: Loss of significant relationship  Historical Factors: NA  Risk Reduction Factors:   Responsible for children under 6 years of age, Sense of responsibility to family, Employed, Living with another person, especially a relative and Positive social support  Continued Clinical Symptoms:  Depression:   Anhedonia  Cognitive Features That Contribute To Risk:  None    Suicide Risk:  Mild:  Suicidal ideation of limited frequency, intensity, duration, and specificity.  There are no identifiable plans, no associated intent, mild dysphoria and related symptoms, good self-control (both objective and subjective assessment), few other risk factors, and identifiable protective factors, including available and accessible social support.  Plan Of Care/Follow-up recommendations:  Continue activity as tolerated. Continue diet as recommended by your PCP. Ensure to keep all appointments with outpatient providers.  Disposition: Discharge home with sister. [place] up with [place] 08/16/2019, 8:21 AM

## 2019-08-16 NOTE — ED Notes (Signed)
Patient A&O x 4, ambulatory. Patient discharged in no acute distress. Patient denied SI/HI, A/VH upon discharge. Patient verbalized understanding of all discharge instructions explained by staff, to include follow up appointments, importance of adhering to prescribed BP medication and safety plan. Pt belongings returned to patient from locker # 15 intact. Patient escorted to lobby via staff for transport to destination via pt's sister. Safety maintained.

## 2019-08-16 NOTE — ED Notes (Signed)
Pt asleep but easily aroused when prompted. Off unit with NP. Denies acute distress upon wakening. Safety maintained.

## 2019-08-16 NOTE — ED Notes (Signed)
Pt given Clonidine 0.1mg . Pt verbalized understanding of indication for medication to lower BP. Safety maintained.

## 2019-08-17 LAB — PROLACTIN: Prolactin: 29.7 ng/mL — ABNORMAL HIGH (ref 4.0–15.2)

## 2019-09-05 ENCOUNTER — Ambulatory Visit (HOSPITAL_COMMUNITY): Payer: BLUE CROSS/BLUE SHIELD | Admitting: Clinical

## 2020-06-27 ENCOUNTER — Encounter (HOSPITAL_COMMUNITY): Payer: Self-pay | Admitting: Emergency Medicine

## 2020-06-27 ENCOUNTER — Other Ambulatory Visit: Payer: Self-pay

## 2020-06-27 ENCOUNTER — Emergency Department (HOSPITAL_COMMUNITY)
Admission: EM | Admit: 2020-06-27 | Discharge: 2020-06-27 | Disposition: A | Discharge status: Home / Self Care | Payer: BC Managed Care – PPO | Attending: Emergency Medicine | Admitting: Emergency Medicine

## 2020-06-27 DIAGNOSIS — Z79899 Other long term (current) drug therapy: Secondary | ICD-10-CM | POA: Insufficient documentation

## 2020-06-27 DIAGNOSIS — F17210 Nicotine dependence, cigarettes, uncomplicated: Secondary | ICD-10-CM | POA: Insufficient documentation

## 2020-06-27 DIAGNOSIS — I10 Essential (primary) hypertension: Secondary | ICD-10-CM | POA: Insufficient documentation

## 2020-06-27 DIAGNOSIS — R1032 Left lower quadrant pain: Secondary | ICD-10-CM | POA: Diagnosis not present

## 2020-06-27 DIAGNOSIS — K219 Gastro-esophageal reflux disease without esophagitis: Secondary | ICD-10-CM | POA: Insufficient documentation

## 2020-06-27 DIAGNOSIS — Z87442 Personal history of urinary calculi: Secondary | ICD-10-CM | POA: Insufficient documentation

## 2020-06-27 DIAGNOSIS — Z8719 Personal history of other diseases of the digestive system: Secondary | ICD-10-CM

## 2020-06-27 MED ORDER — METRONIDAZOLE 500 MG PO TABS: 500.0000 mg | ORAL_TABLET | Freq: Three times a day (TID) | ORAL | 0 refills | Status: AC

## 2020-06-27 MED ORDER — CIPROFLOXACIN HCL 250 MG PO TABS
500.0000 mg | ORAL_TABLET | Freq: Once | ORAL | Status: AC
  Administered 2020-06-27: 500 mg via ORAL
  Filled 2020-06-27: qty 2

## 2020-06-27 MED ORDER — METRONIDAZOLE 500 MG PO TABS
500.0000 mg | ORAL_TABLET | Freq: Once | ORAL | Status: AC
  Administered 2020-06-27: 500 mg via ORAL
  Filled 2020-06-27: qty 1

## 2020-06-27 MED ORDER — CIPROFLOXACIN HCL 500 MG PO TABS
500.0000 mg | ORAL_TABLET | Freq: Two times a day (BID) | ORAL | 0 refills | Status: AC
Start: 2020-06-27 — End: ?

## 2020-06-27 NOTE — ED Triage Notes (Signed)
Pt c/o abd pain that started about 2 wks ago. Hx of diverticulitis.

## 2020-06-27 NOTE — Discharge Instructions (Signed)
Begin taking Cipro and Flagyl as prescribed.  Return to the emergency department if you develop worsening pain, high fever, bloody stools, or other new and concerning symptoms.

## 2020-06-27 NOTE — ED Provider Notes (Signed)
[facility] EMERGENCY DEPARTMENT Provider Note   CSN: [id number] Arrival date & time: 06/27/20  2112     History Chief Complaint  Patient presents with  . Abdominal Pain    Bobby Porter is a 45 y.o. male.  Patient is a 45 year old male with past medical history of recurrent diverticulitis, hypertension, renal calculi.  Patient presents today with complaints of left lower quadrant pain.  This has been worsening over the past 1 to 2 weeks.  Patient has had diverticulitis in the past and this feels similar.  In the past he has been prescribed antibiotics by his primary doctor and it has gone away.  He denies any fevers or chills.  He denies any bloody stool.  The history is provided by the patient.  Abdominal Pain Pain location:  LLQ Pain quality: gnawing   Pain radiates to:  Does not radiate Pain severity:  Moderate Timing:  Constant Progression:  Worsening Chronicity:  New Relieved by:  Nothing Worsened by:  Movement and palpation Ineffective treatments:  None tried      Past Medical History:  Diagnosis Date  . Anxiety   . Depression   . Gallstones   . GERD (gastroesophageal reflux disease)   . History of kidney stones   . Hypertension   . Pituitary tumor     Patient Active Problem List   Diagnosis Date Noted  . Spinal stenosis, lumbar 12/17/2015  . Perianal abscess 05/01/2013    Past Surgical History:  Procedure Laterality Date  . HEMI-MICRODISCECTOMY LUMBAR LAMINECTOMY LEVEL 1 Bilateral 12/17/2015   Procedure: HEMI-LAMINECTOMY DISCECTOMY L5 - S1 BILATERAL  LEVEL 1;  Surgeon: Melina Schools, MD;  Location: [place];  Service: Orthopedics;  Laterality: Bilateral;  . INCISION AND DRAINAGE PERIRECTAL ABSCESS N/A 05/03/2013   Procedure: IRRIGATION AND DEBRIDEMENT PERIRECTAL ABSCESS;  Surgeon: Jamesetta So, MD;  Location: [facility];  Service: General;  Laterality: N/A;       Family History  Problem Relation Age of Onset  . Heart disease Mother   . Thyroid  disease Mother   . Hypertension Mother   . Prostate cancer Father   . Heart disease Father     Social History   Tobacco Use  . Smoking status: Current Some Day Smoker    Packs/day: 0.25    Types: Cigarettes  . Smokeless tobacco: Never Used  Substance Use Topics  . Alcohol use: Yes    Comment: rarely  . Drug use: Yes    Types: Marijuana    Comment: occasional    Home Medications Prior to Admission medications   Medication Sig Start Date End Date Taking? Authorizing Provider  amLODipine (NORVASC) 5 MG tablet Take 5 mg by mouth daily.    [provider]  cabergoline (DOSTINEX) 0.5 MG tablet Take 0.25 mg by mouth 2 (two) times a week. Takes on Tuesday and Saturday.    [provider]  methocarbamol (ROBAXIN) 500 MG tablet Take 1 tablet (500 mg total) by mouth 3 (three) times daily as needed for muscle spasms. 12/17/15   Melina Schools, MD  ondansetron (ZOFRAN) 4 MG tablet Take 1 tablet (4 mg total) by mouth every 8 (eight) hours as needed for nausea or vomiting. 12/17/15   Melina Schools, MD  oxyCODONE-acetaminophen (PERCOCET) 10-325 MG tablet Take 1 tablet by mouth every 4 (four) hours as needed for pain. 12/17/15   Melina Schools, MD    Allergies    No known allergies  Review of Systems   Review  of Systems  Gastrointestinal: Positive for abdominal pain.  All other systems reviewed and are negative.   Physical Exam Updated Vital Signs BP (!) 164/98 (BP Location: Right Arm)   Pulse 76   Temp 97.9 F (36.6 C) (Oral)   Resp 18   Ht 6\' 3"  (1.905 m)   Wt 122.5 kg   SpO2 99%   BMI 33.75 kg/m   Physical Exam Vitals and nursing note reviewed.  Constitutional:      General: He is not in acute distress.    Appearance: He is well-developed. He is not diaphoretic.  HENT:     Head: Normocephalic and atraumatic.  Cardiovascular:     Rate and Rhythm: Normal rate and regular rhythm.     Heart sounds: No murmur heard. No friction rub.  Pulmonary:      Effort: Pulmonary effort is normal. No respiratory distress.     Breath sounds: Normal breath sounds. No wheezing or rales.  Abdominal:     General: Bowel sounds are normal. There is no distension.     Palpations: Abdomen is soft.     Tenderness: There is abdominal tenderness in the left lower quadrant. There is no right CVA tenderness, left CVA tenderness, guarding or rebound.  Musculoskeletal:        General: Normal range of motion.     Cervical back: Normal range of motion and neck supple.  Skin:    General: Skin is warm and dry.  Neurological:     Mental Status: He is alert and oriented to person, place, and time.     Coordination: Coordination normal.     ED Results / Procedures / Treatments   Labs (all labs ordered are listed, but only abnormal results are displayed) Labs Reviewed - No data to display  EKG None  Radiology No results found.  Procedures Procedures   Medications Ordered in ED Medications - No data to display  ED Course  I have reviewed the triage vital signs and the nursing notes.  Pertinent labs & imaging results that were available during my care of the patient were reviewed by me and considered in my medical decision making (see chart for details).    MDM Rules/Calculators/A&P  Patient with history of recurrent diverticulitis presenting with left lower quadrant pain that feels similar to prior flareups.  He denies any bloody stool or vomit.  Patient is afebrile and nontoxic-appearing.  He does have tenderness in the left lower quadrant, however no peritoneal signs.  In the past, patient has been prescribed antibiotics by his primary doctor for these symptoms and these have helped.  Work-up discussed with patient.  He was offered CT scan and laboratory studies, however has declined.  He is requesting antibiotics as these have helped in the past and this feels similar to prior flareups.  I feel as though this is appropriate with the understanding he is  to return in the next 2 days if not improving or if he develops high fever, worsening pain, or bloody stool.  Final Clinical Impression(s) / ED Diagnoses Final diagnoses:  None    Rx / DC Orders ED Discharge Orders    None       Veryl Speak, MD 06/27/20 2323

## 2020-07-04 ENCOUNTER — Other Ambulatory Visit: Payer: Self-pay

## 2020-07-04 ENCOUNTER — Emergency Department (HOSPITAL_COMMUNITY)
Admission: EM | Admit: 2020-07-04 | Discharge: 2020-07-04 | Disposition: A | Discharge status: Home / Self Care | Payer: BC Managed Care – PPO | Attending: Emergency Medicine | Admitting: Emergency Medicine

## 2020-07-04 ENCOUNTER — Encounter (HOSPITAL_COMMUNITY): Payer: Self-pay | Admitting: *Deleted

## 2020-07-04 DIAGNOSIS — I10 Essential (primary) hypertension: Secondary | ICD-10-CM | POA: Diagnosis not present

## 2020-07-04 DIAGNOSIS — Z79899 Other long term (current) drug therapy: Secondary | ICD-10-CM | POA: Diagnosis not present

## 2020-07-04 DIAGNOSIS — R39198 Other difficulties with micturition: Secondary | ICD-10-CM

## 2020-07-04 DIAGNOSIS — R82998 Other abnormal findings in urine: Secondary | ICD-10-CM | POA: Diagnosis not present

## 2020-07-04 DIAGNOSIS — Z87442 Personal history of urinary calculi: Secondary | ICD-10-CM | POA: Insufficient documentation

## 2020-07-04 DIAGNOSIS — F17210 Nicotine dependence, cigarettes, uncomplicated: Secondary | ICD-10-CM | POA: Insufficient documentation

## 2020-07-04 DIAGNOSIS — R3912 Poor urinary stream: Secondary | ICD-10-CM | POA: Diagnosis not present

## 2020-07-04 LAB — URINALYSIS, ROUTINE W REFLEX MICROSCOPIC
Bilirubin Urine: NEGATIVE
Glucose, UA: NEGATIVE mg/dL
Hgb urine dipstick: NEGATIVE
Ketones, ur: NEGATIVE mg/dL
Leukocytes,Ua: NEGATIVE
Nitrite: NEGATIVE
Protein, ur: NEGATIVE mg/dL
Specific Gravity, Urine: 1.014 (ref 1.005–1.030)
pH: 6 (ref 5.0–8.0)

## 2020-07-04 NOTE — ED Triage Notes (Signed)
Pt seen for recent abd pain , hx with diverticulitis.  Finished cipro and flagyl.   Pt states urine has a strong odor and dark in color.

## 2020-07-04 NOTE — ED Notes (Signed)
Urine sent

## 2020-07-04 NOTE — ED Provider Notes (Signed)
[facility] EMERGENCY DEPARTMENT Provider Note   CSN: [id number] Arrival date & time: 07/04/20  2015     History Chief Complaint  Patient presents with  . Abdominal Pain    Bobby Porter is a 45 y.o. male.  HPI   Patient presents with foul smelling urine and change in urinary color x 2-3 days ago. He reports decreased urinary stream. No dysuria, no hematuria. No discharge, rashes, fevers, abdominal pain or sores to the penis. He has had a new sexual partner in the past month without using protection. He is unsure if she is symptomatic.   He was diagnosed with diverticulitis recently and just finished his course of cipro and flagyl today. Also reports history of kidney stones but this does not feel like that.   Past Medical History:  Diagnosis Date  . Anxiety   . Depression   . Gallstones   . GERD (gastroesophageal reflux disease)   . History of kidney stones   . Hypertension   . Pituitary tumor     Patient Active Problem List   Diagnosis Date Noted  . Spinal stenosis, lumbar 12/17/2015  . Perianal abscess 05/01/2013    Past Surgical History:  Procedure Laterality Date  . HEMI-MICRODISCECTOMY LUMBAR LAMINECTOMY LEVEL 1 Bilateral 12/17/2015   Procedure: HEMI-LAMINECTOMY DISCECTOMY L5 - S1 BILATERAL  LEVEL 1;  Surgeon: Melina Schools, MD;  Location: [facility];  Service: Orthopedics;  Laterality: Bilateral;  . INCISION AND DRAINAGE PERIRECTAL ABSCESS N/A 05/03/2013   Procedure: IRRIGATION AND DEBRIDEMENT PERIRECTAL ABSCESS;  Surgeon: Jamesetta So, MD;  Location: [facility];  Service: General;  Laterality: N/A;       Family History  Problem Relation Age of Onset  . Heart disease Mother   . Thyroid disease Mother   . Hypertension Mother   . Prostate cancer Father   . Heart disease Father     Social History   Tobacco Use  . Smoking status: Current Some Day Smoker    Packs/day: 0.25    Types: Cigarettes  . Smokeless tobacco: Never Used  Substance Use Topics  .  Alcohol use: Yes    Comment: rarely  . Drug use: Yes    Types: Marijuana    Comment: occasional    Home Medications Prior to Admission medications   Medication Sig Start Date End Date Taking? Authorizing Provider  amLODipine (NORVASC) 5 MG tablet Take 5 mg by mouth daily.    [provider]  cabergoline (DOSTINEX) 0.5 MG tablet Take 0.25 mg by mouth 2 (two) times a week. Takes on Tuesday and Saturday.    [provider]  ciprofloxacin (CIPRO) 500 MG tablet Take 1 tablet (500 mg total) by mouth 2 (two) times daily. One po bid x 7 days 06/27/20   Veryl Speak, MD  methocarbamol (ROBAXIN) 500 MG tablet Take 1 tablet (500 mg total) by mouth 3 (three) times daily as needed for muscle spasms. 12/17/15   Melina Schools, MD  metroNIDAZOLE (FLAGYL) 500 MG tablet Take 1 tablet (500 mg total) by mouth 3 (three) times daily. One po bid x 7 days 06/27/20   Veryl Speak, MD  ondansetron (ZOFRAN) 4 MG tablet Take 1 tablet (4 mg total) by mouth every 8 (eight) hours as needed for nausea or vomiting. 12/17/15   Melina Schools, MD  oxyCODONE-acetaminophen (PERCOCET) 10-325 MG tablet Take 1 tablet by mouth every 4 (four) hours as needed for pain. 12/17/15   Melina Schools, MD    Allergies  No known allergies  Review of Systems   Review of Systems  Constitutional: Negative for chills, fatigue and fever.  Gastrointestinal: Positive for nausea. Negative for abdominal pain, constipation, diarrhea, rectal pain and vomiting.  Genitourinary: Positive for decreased urine volume. Negative for difficulty urinating, dysuria, enuresis, flank pain, frequency, genital sores, hematuria, penile discharge, penile pain, penile swelling, scrotal swelling, testicular pain and urgency.  Skin: Negative for color change and rash.    Physical Exam Updated Vital Signs BP (!) 150/110 (BP Location: Right Arm)   Pulse 68   Temp 98.1 F (36.7 C) (Oral)   Resp 16   SpO2 97%   Physical Exam Vitals and nursing  note reviewed. Exam conducted with a chaperone present.  Constitutional:      General: He is not in acute distress.    Appearance: Normal appearance.  HENT:     Head: Normocephalic and atraumatic.  Eyes:     General: No scleral icterus.    Extraocular Movements: Extraocular movements intact.     Pupils: Pupils are equal, round, and reactive to light.  Cardiovascular:     Rate and Rhythm: Normal rate and regular rhythm.     Pulses: Normal pulses.     Heart sounds: Normal heart sounds.  Pulmonary:     Effort: Pulmonary effort is normal.     Breath sounds: Normal breath sounds.  Abdominal:     General: Abdomen is flat.     Tenderness: There is no abdominal tenderness. There is no right CVA tenderness or left CVA tenderness.  Skin:    Coloration: Skin is not jaundiced.  Neurological:     Mental Status: He is alert. Mental status is at baseline.     Coordination: Coordination normal.     ED Results / Procedures / Treatments   Labs (all labs ordered are listed, but only abnormal results are displayed) Labs Reviewed - No data to display  EKG None  Radiology No results found.  Procedures Procedures   Medications Ordered in ED Medications - No data to display  ED Course  I have reviewed the triage vital signs and the nursing notes.  Pertinent labs & imaging results that were available during my care of the patient were reviewed by me and considered in my medical decision making (see chart for details).    MDM Rules/Calculators/A&P                          Patient presents for decreased urinary stream, changes in urine color and smell. Vitals are stable, he is nontoxic appearing. PE unremarkable.   Doubt there is any abdominal involvement given lack of pain. Doubt kidney stones for same reason (no cvat, not uncomfortable). Doubt BPH given patient age and acute onset. Suspect patient has a UTI or STI. Will start with UA and GC/CT.  Patient without findings for UTI.  Discussed that if the GC/CT come back positive he will be called. He is agreeable with that. Return precautions given.   Final Clinical Impression(s) / ED Diagnoses Final diagnoses:  None    Rx / DC Orders ED Discharge Orders    None       Sherrill Raring, Hershal Coria 07/04/20 2247    Noemi Chapel, MD 07/05/20 2107

## 2020-07-04 NOTE — Discharge Instructions (Signed)
You were seen today for urinary concerns. Your tests did not show signs of a urinary infection. You will receive a call tomorrow concerning your STI screen if they return positive. You will not hear from [place] if negative. Should you develop a fever, flank pain, nausea, or vomiting please return for further evaluation.

## 2020-07-07 LAB — URINE CULTURE: Culture: NO GROWTH

## 2020-07-07 LAB — GC/CHLAMYDIA PROBE AMP (~~LOC~~) NOT AT ARMC
Chlamydia: NEGATIVE
Comment: NEGATIVE
Comment: NORMAL
Neisseria Gonorrhea: NEGATIVE

## 2021-03-17 DIAGNOSIS — M542 Cervicalgia: Secondary | ICD-10-CM | POA: Diagnosis not present

## 2021-03-17 DIAGNOSIS — M9901 Segmental and somatic dysfunction of cervical region: Secondary | ICD-10-CM | POA: Diagnosis not present

## 2021-03-17 DIAGNOSIS — M5412 Radiculopathy, cervical region: Secondary | ICD-10-CM | POA: Diagnosis not present

## 2021-03-17 DIAGNOSIS — M5416 Radiculopathy, lumbar region: Secondary | ICD-10-CM | POA: Diagnosis not present

## 2021-03-17 DIAGNOSIS — M5451 Vertebrogenic low back pain: Secondary | ICD-10-CM | POA: Diagnosis not present

## 2021-03-17 DIAGNOSIS — M9903 Segmental and somatic dysfunction of lumbar region: Secondary | ICD-10-CM | POA: Diagnosis not present

## 2021-03-17 DIAGNOSIS — M62838 Other muscle spasm: Secondary | ICD-10-CM | POA: Diagnosis not present
